# Patient Record
Sex: Female | Born: 1960 | Race: White | Hispanic: No | Marital: Married | Smoking: Former smoker
Health system: Southern US, Community
[De-identification: ages and names within clinical notes are randomized; demographics above are authoritative.]

## PROBLEM LIST (undated history)

## (undated) DIAGNOSIS — K7581 Nonalcoholic steatohepatitis (NASH): Secondary | ICD-10-CM

## (undated) DIAGNOSIS — E119 Type 2 diabetes mellitus without complications: Secondary | ICD-10-CM

## (undated) DIAGNOSIS — D126 Benign neoplasm of colon, unspecified: Secondary | ICD-10-CM

## (undated) DIAGNOSIS — I10 Essential (primary) hypertension: Secondary | ICD-10-CM

## (undated) HISTORY — PX: BREAST SURGERY: SHX581

---
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
## 2004-03-03 ENCOUNTER — Ambulatory Visit: Payer: Self-pay | Admitting: Obstetrics and Gynecology

## 2012-09-12 ENCOUNTER — Emergency Department: Payer: Self-pay | Admitting: Emergency Medicine

## 2012-09-12 LAB — COMPREHENSIVE METABOLIC PANEL
Albumin: 3.6 g/dL (ref 3.4–5.0)
Alkaline Phosphatase: 127 U/L (ref 50–136)
Anion Gap: 8 (ref 7–16)
BUN: 14 mg/dL (ref 7–18)
Bilirubin,Total: 0.8 mg/dL (ref 0.2–1.0)
Calcium, Total: 8.8 mg/dL (ref 8.5–10.1)
Chloride: 97 mmol/L — ABNORMAL LOW (ref 98–107)
Co2: 30 mmol/L (ref 21–32)
Creatinine: 0.97 mg/dL (ref 0.60–1.30)
EGFR (African American): >60
EGFR (Non-African Amer.): >60
Glucose: 343 mg/dL — ABNORMAL HIGH (ref 65–99)
Osmolality: 284 (ref 275–301)
Potassium: 3.1 mmol/L — ABNORMAL LOW (ref 3.5–5.1)
SGOT(AST): 110 U/L — ABNORMAL HIGH (ref 15–37)
SGPT (ALT): 112 U/L — ABNORMAL HIGH (ref 12–78)
Sodium: 135 mmol/L — ABNORMAL LOW (ref 136–145)
Total Protein: 8.1 g/dL (ref 6.4–8.2)

## 2012-09-12 LAB — URINALYSIS, COMPLETE
Bacteria: NONE SEEN
Bilirubin,UR: NEGATIVE
Blood: NEGATIVE
Glucose,UR: >=500 mg/dL (ref 0–75)
Ketone: NEGATIVE
Leukocyte Esterase: NEGATIVE
Nitrite: NEGATIVE
Ph: 6 (ref 4.5–8.0)
Protein: 30
RBC,UR: <1 /HPF (ref 0–5)
Specific Gravity: 1.029 (ref 1.003–1.030)
Squamous Epithelial: 2
WBC UR: <1 /HPF (ref 0–5)

## 2012-09-12 LAB — CBC
HCT: 42.9 % (ref 35.0–47.0)
HGB: 15.1 g/dL (ref 12.0–16.0)
MCH: 31 pg (ref 26.0–34.0)
MCHC: 35.2 g/dL (ref 32.0–36.0)
MCV: 88 fL (ref 80–100)
Platelet: 178 10*3/uL (ref 150–440)
RBC: 4.86 10*6/uL (ref 3.80–5.20)
RDW: 13.4 % (ref 11.5–14.5)
WBC: 9.1 10*3/uL (ref 3.6–11.0)

## 2013-02-08 LAB — HM DIABETES EYE EXAM

## 2013-04-18 ENCOUNTER — Ambulatory Visit: Payer: Self-pay | Admitting: Internal Medicine

## 2013-10-03 DIAGNOSIS — N83201 Unspecified ovarian cyst, right side: Secondary | ICD-10-CM | POA: Insufficient documentation

## 2013-10-03 DIAGNOSIS — N83209 Unspecified ovarian cyst, unspecified side: Secondary | ICD-10-CM | POA: Insufficient documentation

## 2013-10-03 DIAGNOSIS — D219 Benign neoplasm of connective and other soft tissue, unspecified: Secondary | ICD-10-CM | POA: Insufficient documentation

## 2013-12-12 ENCOUNTER — Emergency Department: Payer: Self-pay | Admitting: Emergency Medicine

## 2013-12-12 LAB — CBC WITH DIFFERENTIAL/PLATELET
Basophil #: 0.1 10*3/uL (ref 0.0–0.1)
Basophil %: 0.3 %
Eosinophil #: 0.2 10*3/uL (ref 0.0–0.7)
Eosinophil %: 0.9 %
HCT: 40.9 % (ref 35.0–47.0)
HGB: 13.2 g/dL (ref 12.0–16.0)
Lymphocyte #: 3.3 10*3/uL (ref 1.0–3.6)
Lymphocyte %: 18.1 %
MCH: 26.5 pg (ref 26.0–34.0)
MCHC: 32.3 g/dL (ref 32.0–36.0)
MCV: 82 fL (ref 80–100)
Monocyte #: 1.2 x10 3/mm — ABNORMAL HIGH (ref 0.2–0.9)
Monocyte %: 6.7 %
Neutrophil #: 13.6 10*3/uL — ABNORMAL HIGH (ref 1.4–6.5)
Neutrophil %: 74 %
Platelet: 338 10*3/uL (ref 150–440)
RBC: 4.99 10*6/uL (ref 3.80–5.20)
RDW: 13.8 % (ref 11.5–14.5)
WBC: 18.4 10*3/uL — ABNORMAL HIGH (ref 3.6–11.0)

## 2013-12-12 LAB — COMPREHENSIVE METABOLIC PANEL
Albumin: 2.8 g/dL — ABNORMAL LOW (ref 3.4–5.0)
Alkaline Phosphatase: 53 U/L
Anion Gap: 8 (ref 7–16)
BUN: 16 mg/dL (ref 7–18)
Bilirubin,Total: 0.4 mg/dL (ref 0.2–1.0)
Calcium, Total: 9.6 mg/dL (ref 8.5–10.1)
Chloride: 102 mmol/L (ref 98–107)
Co2: 27 mmol/L (ref 21–32)
Creatinine: 1.04 mg/dL (ref 0.60–1.30)
EGFR (African American): >60
EGFR (Non-African Amer.): >60
Glucose: 116 mg/dL — ABNORMAL HIGH (ref 65–99)
Osmolality: 276 (ref 275–301)
Potassium: 3.9 mmol/L (ref 3.5–5.1)
SGOT(AST): 43 U/L — ABNORMAL HIGH (ref 15–37)
SGPT (ALT): 37 U/L
Sodium: 137 mmol/L (ref 136–145)
Total Protein: 7.4 g/dL (ref 6.4–8.2)

## 2013-12-12 LAB — URINALYSIS, COMPLETE
Bacteria: NONE SEEN
Glucose,UR: NEGATIVE mg/dL (ref 0–75)
Ketone: NEGATIVE
Leukocyte Esterase: NEGATIVE
Nitrite: NEGATIVE
Ph: 5 (ref 4.5–8.0)
Protein: 30
RBC,UR: 1 /HPF (ref 0–5)
Specific Gravity: 1.029 (ref 1.003–1.030)
Squamous Epithelial: 3
WBC UR: 2 /HPF (ref 0–5)

## 2013-12-12 LAB — LIPASE, BLOOD: Lipase: 167 U/L (ref 73–393)

## 2014-02-05 ENCOUNTER — Ambulatory Visit: Payer: Self-pay | Admitting: Gastroenterology

## 2014-03-22 DIAGNOSIS — K7460 Unspecified cirrhosis of liver: Secondary | ICD-10-CM | POA: Insufficient documentation

## 2014-03-22 DIAGNOSIS — R188 Other ascites: Secondary | ICD-10-CM | POA: Insufficient documentation

## 2014-04-02 LAB — HM MAMMOGRAPHY: HM Mammogram: NORMAL

## 2014-10-02 LAB — HM PAP SMEAR
HM Pap smear: NEGATIVE
HM Pap smear: NORMAL

## 2014-11-07 ENCOUNTER — Encounter: Payer: Self-pay | Admitting: Internal Medicine

## 2014-11-07 DIAGNOSIS — E1165 Type 2 diabetes mellitus with hyperglycemia: Secondary | ICD-10-CM | POA: Insufficient documentation

## 2014-11-07 DIAGNOSIS — E785 Hyperlipidemia, unspecified: Secondary | ICD-10-CM

## 2014-11-07 DIAGNOSIS — IMO0002 Reserved for concepts with insufficient information to code with codable children: Secondary | ICD-10-CM | POA: Insufficient documentation

## 2014-11-07 DIAGNOSIS — R188 Other ascites: Secondary | ICD-10-CM | POA: Insufficient documentation

## 2014-11-07 DIAGNOSIS — E1169 Type 2 diabetes mellitus with other specified complication: Secondary | ICD-10-CM | POA: Insufficient documentation

## 2014-11-07 DIAGNOSIS — R180 Malignant ascites: Secondary | ICD-10-CM | POA: Insufficient documentation

## 2014-11-07 DIAGNOSIS — I10 Essential (primary) hypertension: Secondary | ICD-10-CM | POA: Insufficient documentation

## 2014-11-26 ENCOUNTER — Encounter: Payer: Self-pay | Admitting: Internal Medicine

## 2014-11-26 ENCOUNTER — Ambulatory Visit (INDEPENDENT_AMBULATORY_CARE_PROVIDER_SITE_OTHER): Payer: BLUE CROSS/BLUE SHIELD | Admitting: Internal Medicine

## 2014-11-26 VITALS — BP 132/70 | HR 68 | Ht 67.0 in | Wt 215.2 lb

## 2014-11-26 DIAGNOSIS — R180 Malignant ascites: Secondary | ICD-10-CM | POA: Diagnosis not present

## 2014-11-26 DIAGNOSIS — E1165 Type 2 diabetes mellitus with hyperglycemia: Secondary | ICD-10-CM

## 2014-11-26 DIAGNOSIS — IMO0002 Reserved for concepts with insufficient information to code with codable children: Secondary | ICD-10-CM

## 2014-11-26 DIAGNOSIS — I10 Essential (primary) hypertension: Secondary | ICD-10-CM | POA: Diagnosis not present

## 2014-11-26 DIAGNOSIS — R188 Other ascites: Secondary | ICD-10-CM

## 2014-11-26 MED ORDER — METFORMIN HCL 500 MG PO TABS: 500.0000 mg | ORAL_TABLET | Freq: Two times a day (BID) | ORAL | Status: DC

## 2014-11-26 MED ORDER — AMLODIPINE BESYLATE 10 MG PO TABS: 10.0000 mg | ORAL_TABLET | Freq: Every day | ORAL | Status: DC

## 2014-11-26 MED ORDER — LISINOPRIL-HYDROCHLOROTHIAZIDE 20-12.5 MG PO TABS: 2.0000 | ORAL_TABLET | Freq: Every day | ORAL | Status: DC

## 2014-11-26 NOTE — Patient Instructions (Addendum)
Alternative to Januvia:  Daphine Deutscher

## 2014-11-26 NOTE — Progress Notes (Signed)
Date:  11/26/2014   Name:  Meagan Ibarra   DOB:  1961-01-22   MRN:  [id number]   Chief Complaint: Diabetes and Hypertension Diabetes She presents for her follow-up diabetic visit. She has type 2 diabetes mellitus. Her disease course has been stable. Pertinent negatives for diabetes include no chest pain and no fatigue. Symptoms are stable. Current diabetic treatment includes oral agent (monotherapy) (she was supposed to be on Januvia but it is now $90/mo.). She is compliant with treatment all of the time. Her weight is decreasing steadily. She is following a diabetic and generally healthy diet. She participates in exercise daily. Her breakfast blood glucose is taken between 7-8 am. Her breakfast blood glucose range is generally 110-130 mg/dl. An ACE inhibitor/angiotensin II receptor blocker is being taken.  Hypertension This is a chronic problem. The current episode started more than 1 year ago. The problem is unchanged. The problem is controlled. Pertinent negatives include no chest pain, palpitations or shortness of breath. There are no associated agents to hypertension. Risk factors for coronary artery disease include diabetes mellitus. Past treatments include ACE inhibitors, calcium channel blockers and diuretics. The current treatment provides significant improvement.  NASH with Fibrosis Pt is trying to enter a study at [facility] for a synthetic bile acid.  She continues to follow up with her GI MD.  A liver biopsy is being planned.  She also has a consultation for a colonoscopy next week.  Review of Systems:  Review of Systems  Constitutional: Negative for chills, fatigue and unexpected weight change.  HENT: Negative for hearing loss.   Eyes: Negative for visual disturbance.  Respiratory: Negative for cough, chest tightness and shortness of breath.   Cardiovascular: Negative for chest pain, palpitations and leg swelling.  Gastrointestinal: Negative for abdominal pain, constipation and blood in  stool.  Musculoskeletal: Negative for myalgias and joint swelling.  Skin: Negative for rash.  Neurological: Negative for light-headedness and numbness.    Patient Active Problem List   Diagnosis Date Noted  . Ascites of liver 11/07/2014  . Diabetes mellitus type 2, uncontrolled 11/07/2014  . Essential (primary) hypertension 11/07/2014  . Combined fat and carbohydrate induced hyperlipemia 11/07/2014  . Cirrhosis 03/22/2014  . Cyst of ovary 10/03/2013  . Fibroid 10/03/2013    Prior to Admission medications   Medication Sig Start Date End Date Taking? Authorizing Provider  amLODipine (NORVASC) 10 MG tablet Take 1 tablet by mouth daily. 10/25/13  Yes Historical Provider, MD  lisinopril-hydrochlorothiazide (PRINZIDE,ZESTORETIC) 20-12.5 MG per tablet Take 2 tablets by mouth daily. 10/25/13  Yes Historical Provider, MD  metFORMIN (GLUCOPHAGE) 500 MG tablet Take 1 tablet by mouth 2 (two) times daily. 10/25/13  Yes Historical Provider, MD  Norgestimate-Ethinyl Estradiol Triphasic (TRI-SPRINTEC) 0.18/0.215/0.25 MG-35 MCG tablet Take 1 tablet by mouth daily.   Yes Historical Provider, MD    No Known Allergies  Past Surgical History  Procedure Laterality Date  . Breast reduction surgery  1980    History  Substance Use Topics  . Smoking status: Never Smoker   . Smokeless tobacco: Not on file  . Alcohol Use: No     Medication list has been reviewed and updated.  Physical Examination:  Physical Exam  Constitutional: She is oriented to person, place, and time. She appears well-developed and well-nourished.  Neck: Normal range of motion. Neck supple. No thyromegaly present.  Cardiovascular: Normal rate, regular rhythm and normal heart sounds.   Pulmonary/Chest: Effort normal and breath sounds normal. She has no wheezes.  Musculoskeletal:  She exhibits no edema or tenderness.  Neurological: She is alert and oriented to person, place, and time. She has normal reflexes.  Skin: Skin is warm  and dry.  Psychiatric: She has a normal mood and affect.    BP 132/70 mmHg  Pulse 68  Ht 5\' 7"  (1.702 m)  Wt 215 lb 3.2 oz (97.614 kg)  BMI 33.70 kg/m2  Assessment and Plan: 1. Essential (primary) hypertension The current medical regimen is effective;  continue present plan and medications. - lisinopril-hydrochlorothiazide (PRINZIDE,ZESTORETIC) 20-12.5 MG per tablet; Take 2 tablets by mouth daily.  Dispense: 180 tablet; Refill: 3 - amLODipine (NORVASC) 10 MG tablet; Take 1 tablet (10 mg total) by mouth daily.  Dispense: 90 tablet; Refill: 3  2. Diabetes mellitus type 2, uncontrolled Will check labs - consider alternative to [place] (pt will check with insurance on coverage) - Hemoglobin A1c - metFORMIN (GLUCOPHAGE) 500 MG tablet; Take 1 tablet (500 mg total) by mouth 2 (two) times daily.  Dispense: 180 tablet; Refill: 3  3. Ascites of liver Continue follow up at Nestor Ramp, MD [facility]  11/26/2014

## 2014-11-27 LAB — HEMOGLOBIN A1C
Est. average glucose Bld gHb Est-mCnc: 143 mg/dL
Hgb A1c MFr Bld: 6.6 % — ABNORMAL HIGH (ref 4.8–5.6)

## 2014-12-28 ENCOUNTER — Encounter: Payer: Self-pay | Admitting: *Deleted

## 2014-12-31 ENCOUNTER — Encounter
Admission: RE | Disposition: A | Discharge status: Home / Self Care | Payer: Self-pay | Source: Ambulatory Visit | Attending: Gastroenterology

## 2014-12-31 ENCOUNTER — Ambulatory Visit: Payer: BLUE CROSS/BLUE SHIELD | Admitting: Anesthesiology

## 2014-12-31 ENCOUNTER — Encounter: Payer: Self-pay | Admitting: *Deleted

## 2014-12-31 ENCOUNTER — Ambulatory Visit
Admission: RE | Admit: 2014-12-31 | Discharge: 2014-12-31 | Disposition: A | Discharge status: Home / Self Care | Payer: BLUE CROSS/BLUE SHIELD | Source: Ambulatory Visit | Attending: Gastroenterology | Admitting: Gastroenterology

## 2014-12-31 DIAGNOSIS — K7581 Nonalcoholic steatohepatitis (NASH): Secondary | ICD-10-CM | POA: Insufficient documentation

## 2014-12-31 DIAGNOSIS — I10 Essential (primary) hypertension: Secondary | ICD-10-CM | POA: Diagnosis not present

## 2014-12-31 DIAGNOSIS — D120 Benign neoplasm of cecum: Secondary | ICD-10-CM | POA: Insufficient documentation

## 2014-12-31 DIAGNOSIS — Z8371 Family history of colonic polyps: Secondary | ICD-10-CM | POA: Diagnosis not present

## 2014-12-31 DIAGNOSIS — D127 Benign neoplasm of rectosigmoid junction: Secondary | ICD-10-CM | POA: Insufficient documentation

## 2014-12-31 DIAGNOSIS — Z79899 Other long term (current) drug therapy: Secondary | ICD-10-CM | POA: Insufficient documentation

## 2014-12-31 DIAGNOSIS — I252 Old myocardial infarction: Secondary | ICD-10-CM | POA: Diagnosis not present

## 2014-12-31 DIAGNOSIS — Z1211 Encounter for screening for malignant neoplasm of colon: Secondary | ICD-10-CM | POA: Diagnosis not present

## 2014-12-31 DIAGNOSIS — K621 Rectal polyp: Secondary | ICD-10-CM | POA: Diagnosis not present

## 2014-12-31 DIAGNOSIS — E119 Type 2 diabetes mellitus without complications: Secondary | ICD-10-CM | POA: Insufficient documentation

## 2014-12-31 DIAGNOSIS — D123 Benign neoplasm of transverse colon: Secondary | ICD-10-CM | POA: Insufficient documentation

## 2014-12-31 DIAGNOSIS — D125 Benign neoplasm of sigmoid colon: Secondary | ICD-10-CM | POA: Diagnosis not present

## 2014-12-31 HISTORY — DX: Nonalcoholic steatohepatitis (NASH): K75.81

## 2014-12-31 HISTORY — PX: COLONOSCOPY WITH PROPOFOL: SHX5780

## 2014-12-31 HISTORY — DX: Type 2 diabetes mellitus without complications: E11.9

## 2014-12-31 HISTORY — DX: Essential (primary) hypertension: I10

## 2014-12-31 LAB — CBC
HCT: 39.4 % (ref 35.0–47.0)
Hemoglobin: 13.2 g/dL (ref 12.0–16.0)
MCH: 27.7 pg (ref 26.0–34.0)
MCHC: 33.4 g/dL (ref 32.0–36.0)
MCV: 83 fL (ref 80.0–100.0)
Platelets: 208 10*3/uL (ref 150–440)
RBC: 4.75 MIL/uL (ref 3.80–5.20)
RDW: 15.3 % — ABNORMAL HIGH (ref 11.5–14.5)
WBC: 9.6 10*3/uL (ref 3.6–11.0)

## 2014-12-31 LAB — PROTIME-INR
INR: 1
Prothrombin Time: 13.4 seconds (ref 11.4–15.0)

## 2014-12-31 LAB — POCT PREGNANCY, URINE: Preg Test, Ur: NEGATIVE

## 2014-12-31 SURGERY — COLONOSCOPY WITH PROPOFOL
Anesthesia: General

## 2014-12-31 MED ORDER — PROPOFOL 10 MG/ML IV BOLUS
INTRAVENOUS | Status: DC | PRN
  Administered 2014-12-31: 100 mg via INTRAVENOUS

## 2014-12-31 MED ORDER — PHENYLEPHRINE HCL 10 MG/ML IJ SOLN
INTRAMUSCULAR | Status: DC | PRN
  Administered 2014-12-31 (×2): 100 ug via INTRAVENOUS

## 2014-12-31 MED ORDER — LIDOCAINE HCL (CARDIAC) 20 MG/ML IV SOLN
INTRAVENOUS | Status: DC | PRN
  Administered 2014-12-31: 20 mg via INTRAVENOUS

## 2014-12-31 MED ORDER — SODIUM CHLORIDE 0.9 % IV SOLN
INTRAVENOUS | Status: DC
  Administered 2014-12-31: 1000 mL via INTRAVENOUS

## 2014-12-31 MED ORDER — SODIUM CHLORIDE 0.9 % IV SOLN: INTRAVENOUS | Status: DC

## 2014-12-31 MED ORDER — PROPOFOL INFUSION 10 MG/ML OPTIME
INTRAVENOUS | Status: DC | PRN
  Administered 2014-12-31: 150 ug/kg/min via INTRAVENOUS

## 2014-12-31 NOTE — Op Note (Signed)
[facility] Gastroenterology Patient Name: Meagan Ibarra Procedure Date: 12/31/2014 2:17 PM MRN: [id number] Account #: [id number] Date of Birth: [birth year] Admit Type: Outpatient Age: 54 Room: [facility] ENDO ROOM 4 Gender: Female Note Status: Finalized Procedure:         Colonoscopy Indications:       Family history of colonic polyps in a first-degree relative Providers:         Lollie Sails, MD Referring MD:      Halina Maidens, MD (Referring MD) Medicines:         Monitored Anesthesia Care Complications:     No immediate complications. Procedure:         Pre-Anesthesia Assessment:                    - ASA Grade Assessment: III - A patient with severe                     systemic disease.                    After obtaining informed consent, the colonoscope was                     passed under direct vision. Throughout the procedure, the                     patient's blood pressure, pulse, and oxygen saturations                     were monitored continuously. The Olympus PCF-H180AL                     colonoscope ( S#: [id number] ) was introduced through the                     anus and advanced to the the cecum, identified by                     appendiceal orifice and ileocecal valve. The colonoscopy                     was performed with moderate difficulty. Successful                     completion of the procedure was aided by changing the                     patient to a supine position. The patient tolerated the                     procedure well. The quality of the bowel preparation was                     good. Findings:      A 2 mm polyp was found in the transverse colon. The polyp was sessile.       The polyp was removed with a cold biopsy forceps. Resection and       retrieval were complete.      A 7 mm polyp was found in the cecum. The polyp was sessile. The polyp       was removed with a cold snare. Resection and retrieval were complete.      A  localized area of granular mucosa was found at the ileocecal valve.  This was biopsied with a cold forceps for histology.      A 5 mm polyp was found at the hepatic flexure. The polyp was sessile.       The polyp was removed with a cold snare. Resection and retrieval were       complete.      A 2 mm polyp was found at the hepatic flexure. The polyp was sessile.       The polyp was removed with a cold biopsy forceps. Resection and       retrieval were complete.      A 8 mm polyp was found in the proximal transverse colon. The polyp was       sessile. The polyp was removed with a cold snare. Resection and       retrieval were complete.      A 10 mm polyp was found in the proximal sigmoid colon. The polyp was       sessile. The polyp was removed with a hot snare. Resection and retrieval       were complete.      A 11 mm polyp was found in the distal sigmoid colon. The polyp was       sessile. The polyp was removed with a hot snare. Resection and retrieval       were complete.      Six sessile polyps were found in the distal sigmoid colon. The polyps       were 1 to 2 mm in size. These polyps were removed with a cold biopsy       forceps. Resection and retrieval were complete.      A 8 mm polyp was found in the recto-sigmoid colon. The polyp was       sessile. The polyp was removed with a hot snare. Resection and retrieval       were complete.      Three sessile polyps were found in the recto-sigmoid colon. The polyps       were 1 to 2 mm in size. These polyps were removed with a cold biopsy       forceps. Resection and retrieval were complete. (placed in jar with       previous rectosigmoid polyp)      Two sessile polyps were found in the rectum. The polyps were 1 to 2 mm       in size. These polyps were removed with a cold biopsy forceps. Resection       and retrieval were complete.      No additional abnormalities were found on retroflexion.      Non-bleeding internal hemorrhoids  were found during retroflexion. The       hemorrhoids were small. Impression:        - One 2 mm polyp in the transverse colon. Resected and                     retrieved.                    - One 7 mm polyp in the cecum. Resected and retrieved.                    - Granularity at the ileocecal valve. Biopsied.                    - One 5 mm polyp at the hepatic flexure. Resected and  retrieved.                    - One 2 mm polyp at the hepatic flexure. Resected and                     retrieved.                    - One 8 mm polyp in the proximal transverse colon.                     Resected and retrieved.                    - One 10 mm polyp in the proximal sigmoid colon. Resected                     and retrieved.                    - One 11 mm polyp in the distal sigmoid colon. Resected                     and retrieved.                    - Six 1 to 2 mm polyps in the distal sigmoid colon.                     Resected and retrieved.                    - One 8 mm polyp at the recto-sigmoid colon. Resected and                     retrieved.                    - Three 1 to 2 mm polyps at the recto-sigmoid colon.                     Resected and retrieved.                    - Two 1 to 2 mm polyps in the rectum. Resected and                     retrieved. Recommendation:    - Telephone GI clinic for pathology results in 1 week.                    - Full liquid diet and low residue diet for 5 days. Procedure Code(s): --- Professional ---                    [phone], Colonoscopy, flexible; with removal of tumor(s),                     polyp(s), or other lesion(s) by snare technique                    45380, 39, Colonoscopy, flexible; with biopsy, single or                     multiple Diagnosis Code(s): --- Professional ---                    211.3, Benign neoplasm of colon  211.4, Benign neoplasm of rectum and anal canal                    569.89,  Other specified disorders of intestine                    569.0, Anal and rectal polyp                    V18.51, Family history of colonic polyps CPT copyright 2014 American Medical Association. All rights reserved. The codes documented in this report are preliminary and upon coder review may  be revised to meet current compliance requirements. Lollie Sails, MD 12/31/2014 3:31:55 PM This report has been signed electronically. Number of Addenda: 0 Note Initiated On: 12/31/2014 2:17 PM Scope Withdrawal Time: 0 hours 46 minutes 3 seconds  Total Procedure Duration: 0 hours 58 minutes 32 seconds       [facility]

## 2014-12-31 NOTE — Transfer of Care (Signed)
Immediate Anesthesia Transfer of Care Note  Patient: Meagan Ibarra  Procedure(s) Performed: Procedure(s): COLONOSCOPY WITH PROPOFOL (N/A)  Patient Location: Endoscopy Unit  Anesthesia Type:General  Level of Consciousness: sedated  Airway & Oxygen Therapy: Patient Spontanous Breathing and Patient connected to nasal cannula oxygen  Post-op Assessment: Report given to RN and Post -op Vital signs reviewed and stable  Post vital signs: Reviewed and stable  Last Vitals:  Filed Vitals:   12/31/14 1343  BP: 146/86  Pulse: 82  Temp:   Resp: 20    Complications: No apparent anesthesia complications

## 2014-12-31 NOTE — Anesthesia Postprocedure Evaluation (Signed)
  Anesthesia Post-op Note  Patient: Meagan Ibarra  Procedure(s) Performed: Procedure(s): COLONOSCOPY WITH PROPOFOL (N/A)  Anesthesia type:General  Patient location: PACU  Post pain: Pain level controlled  Post assessment: Post-op Vital signs reviewed, Patient's Cardiovascular Status Stable, Respiratory Function Stable, Patent Airway and No signs of Nausea or vomiting  Post vital signs: Reviewed and stable  Last Vitals:  Filed Vitals:   12/31/14 1600  BP: 124/75  Pulse: 54  Temp:   Resp: 18    Level of consciousness: awake, alert  and patient cooperative  Complications: No apparent anesthesia complications

## 2014-12-31 NOTE — H&P (Signed)
Outpatient short stay form Pre-procedure 12/31/2014 2:13 PM Lollie Sails MD  Primary Physician: Dr. Otilio Miu  Reason for visit:  Greening colonoscopy  History of present illness:  Patient is a 54 year old female presenting for her first colonoscopy. He does have a family history of colon polyps and a primary relative. She tolerated her prep well. She takes no aspirin or anticoagulation medications.    Current facility-administered medications:  .  0.9 %  sodium chloride infusion, , Intravenous, Continuous, Lollie Sails, MD, Last Rate: 50 mL/hr at 12/31/14 1354, 1,000 mL at 12/31/14 1354 .  0.9 %  sodium chloride infusion, , Intravenous, Continuous, Lollie Sails, MD  Prescriptions prior to admission  Medication Sig Dispense Refill Last Dose  . amLODipine (NORVASC) 10 MG tablet Take 1 tablet (10 mg total) by mouth daily. 90 tablet 3 12/31/2014 at 630am  . lisinopril-hydrochlorothiazide (PRINZIDE,ZESTORETIC) 20-12.5 MG per tablet Take 2 tablets by mouth daily. 180 tablet 3 12/31/2014 at 630am  . metFORMIN (GLUCOPHAGE) 500 MG tablet Take 1 tablet (500 mg total) by mouth 2 (two) times daily. 180 tablet 3 12/30/2014 at Unknown time  . Norgestimate-Ethinyl Estradiol Triphasic (TRI-SPRINTEC) 0.18/0.215/0.25 MG-35 MCG tablet Take 1 tablet by mouth daily.   12/31/2014 at 630am     No Known Allergies   Past Medical History  Diagnosis Date  . Hypertension   . Diabetes mellitus without complication   . NASH (nonalcoholic steatohepatitis)     Review of systems:      Physical Exam    Heart and lungs: Regular rate and rhythm without rub or gallop lungs are bilaterally clear.    HEENT: Normocephalic atraumatic eyes are anicteric    Other:     Pertinant exam for procedure: Soft nontender nondistended bowel sounds positive normoactive    Planned proceedures: Colonoscopy and indicated procedures I have discussed the risks benefits and complications of procedures to include  not limited to bleeding, infection, perforation and the risk of sedation and the patient wishes to proceed.    Lollie Sails, MD Gastroenterology 12/31/2014  2:13 PM

## 2014-12-31 NOTE — Anesthesia Preprocedure Evaluation (Signed)
Anesthesia Evaluation  Patient identified by MRN, date of birth, ID band Patient awake    Reviewed: Allergy & Precautions, H&P , NPO status , Patient's Chart, lab work & pertinent test results  Airway Mallampati: III  TM Distance: >3 FB Neck ROM: limited    Dental no notable dental hx. (+) Teeth Intact   Pulmonary neg pulmonary ROS,  breath sounds clear to auscultation  Pulmonary exam normal       Cardiovascular Exercise Tolerance: Good hypertension, - Past MI Normal cardiovascular examRhythm:regular Rate:Normal     Neuro/Psych negative neurological ROS  negative psych ROS   GI/Hepatic negative GI ROS, (+) Hepatitis -  Endo/Other  diabetes, Well Controlled, Type 2  Renal/GU negative Renal ROS  negative genitourinary   Musculoskeletal   Abdominal   Peds  Hematology negative hematology ROS (+)   Anesthesia Other Findings Past Medical History:   Hypertension                                                 Diabetes mellitus without complication                       NASH (nonalcoholic steatohepatitis)                          Signs and symptoms suggestive of sleep apnea    Reproductive/Obstetrics negative OB ROS                             Anesthesia Physical Anesthesia Plan  ASA: III  Anesthesia Plan: General   Post-op Pain Management:    Induction:   Airway Management Planned:   Additional Equipment:   Intra-op Plan:   Post-operative Plan:   Informed Consent: I have reviewed the patients History and Physical, chart, labs and discussed the procedure including the risks, benefits and alternatives for the proposed anesthesia with the patient or authorized representative who has indicated his/her understanding and acceptance.   Dental Advisory Given  Plan Discussed with: Anesthesiologist, CRNA and Surgeon  Anesthesia Plan Comments:         Anesthesia Quick  Evaluation

## 2015-01-02 ENCOUNTER — Encounter: Payer: Self-pay | Admitting: Gastroenterology

## 2015-01-03 LAB — SURGICAL PATHOLOGY

## 2015-06-11 ENCOUNTER — Encounter: Payer: Self-pay | Admitting: Internal Medicine

## 2015-06-11 ENCOUNTER — Ambulatory Visit: Payer: BLUE CROSS/BLUE SHIELD | Admitting: Internal Medicine

## 2015-09-25 ENCOUNTER — Ambulatory Visit (INDEPENDENT_AMBULATORY_CARE_PROVIDER_SITE_OTHER): Payer: BLUE CROSS/BLUE SHIELD | Admitting: Internal Medicine

## 2015-09-25 ENCOUNTER — Encounter: Payer: Self-pay | Admitting: Internal Medicine

## 2015-09-25 VITALS — BP 120/80 | HR 87 | Resp 16 | Ht 67.0 in | Wt 214.0 lb

## 2015-09-25 DIAGNOSIS — K7460 Unspecified cirrhosis of liver: Secondary | ICD-10-CM | POA: Diagnosis not present

## 2015-09-25 DIAGNOSIS — E1165 Type 2 diabetes mellitus with hyperglycemia: Secondary | ICD-10-CM

## 2015-09-25 DIAGNOSIS — E119 Type 2 diabetes mellitus without complications: Secondary | ICD-10-CM | POA: Insufficient documentation

## 2015-09-25 DIAGNOSIS — K760 Fatty (change of) liver, not elsewhere classified: Secondary | ICD-10-CM

## 2015-09-25 DIAGNOSIS — IMO0001 Reserved for inherently not codable concepts without codable children: Secondary | ICD-10-CM

## 2015-09-25 DIAGNOSIS — I10 Essential (primary) hypertension: Secondary | ICD-10-CM | POA: Diagnosis not present

## 2015-09-25 MED ORDER — CANAGLIFLOZIN 100 MG PO TABS: 100.0000 mg | ORAL_TABLET | Freq: Every day | ORAL | Status: DC

## 2015-09-25 NOTE — Progress Notes (Signed)
Date:  09/25/2015   Name:  Meagan Ibarra   DOB:  1961/04/21   MRN:  [id number]  Elevated Blood Sugars [facility] Trial has been doing A1C 7.2 last month. BS 199-210 30 minutes after eating.  She feels that she is struggling with diabetes control despite following a good diet and losing weight.  Chief Complaint: Diabetes Diabetes She presents for her follow-up diabetic visit. She has type 2 diabetes mellitus. Her disease course has been worsening. Pertinent negatives for hypoglycemia include no dizziness or headaches. Pertinent negatives for diabetes include no chest pain and no fatigue. Current diabetic treatment includes oral agent (monotherapy) (metformin 500 mg bid). Her weight is stable. She monitors urine at home 1-2 x per day. Her breakfast blood glucose is taken between 7-8 am. Her breakfast blood glucose range is generally 180-200 mg/dl. An ACE inhibitor/angiotensin II receptor blocker is being taken.    Lab Results  Component Value Date   HGBA1C 6.6* 11/26/2014     Review of Systems  Constitutional: Negative for fever, chills and fatigue.  Eyes: Negative for visual disturbance.  Respiratory: Negative for chest tightness, shortness of breath and wheezing.   Cardiovascular: Negative for chest pain, palpitations and leg swelling.  Gastrointestinal: Negative for nausea, abdominal pain and diarrhea.  Genitourinary: Negative for frequency, hematuria and pelvic pain.  Neurological: Negative for dizziness, numbness and headaches.    Patient Active Problem List   Diagnosis Date Noted  . Uncontrolled type 2 diabetes mellitus without complication, without long-term current use of insulin ([place]) 09/25/2015  . Ascites of liver 11/07/2014  . Essential (primary) hypertension 11/07/2014  . Combined fat and carbohydrate induced hyperlipemia 11/07/2014  . Ascites, malignant 11/07/2014  . Cirrhosis ([place]) 03/22/2014  . Cyst of ovary 10/03/2013  . Fibroid 10/03/2013  . Cyst of right ovary  10/03/2013    Prior to Admission medications   Medication Sig Start Date End Date Taking? Authorizing Provider  amLODipine (NORVASC) 10 MG tablet Take 1 tablet (10 mg total) by mouth daily. 11/26/14   Glean Hess, MD  lisinopril-hydrochlorothiazide (PRINZIDE,ZESTORETIC) 20-12.5 MG per tablet Take 2 tablets by mouth daily. 11/26/14   Glean Hess, MD  metFORMIN (GLUCOPHAGE) 500 MG tablet Take 1 tablet (500 mg total) by mouth 2 (two) times daily. 11/26/14   Glean Hess, MD  Norgestimate-Ethinyl Estradiol Triphasic (TRI-SPRINTEC) 0.18/0.215/0.25 MG-35 MCG tablet Take 1 tablet by mouth daily.    Historical Provider, MD    No Known Allergies  Past Surgical History  Procedure Laterality Date  . Breast reduction surgery  1980  . Breast surgery    . Colonoscopy with propofol N/A 12/31/2014    Procedure: COLONOSCOPY WITH PROPOFOL;  Surgeon: Lollie Sails, MD;  Location: [facility] ENDOSCOPY;  Service: Endoscopy;  Laterality: N/A;    Social History  Substance Use Topics  . Smoking status: Never Smoker   . Smokeless tobacco: None  . Alcohol Use: No     Medication list has been reviewed and updated.   Physical Exam  Constitutional: She is oriented to person, place, and time. She appears well-developed. No distress.  HENT:  Head: Normocephalic and atraumatic.  Neck: Normal range of motion. Neck supple. Carotid bruit is not present.  Cardiovascular: Normal rate, regular rhythm and normal heart sounds.   Pulmonary/Chest: Effort normal and breath sounds normal. No respiratory distress.  Musculoskeletal: Normal range of motion.  Lymphadenopathy:    She has no cervical adenopathy.  Neurological: She is alert and oriented to person,  place, and time.  Reflex Scores:      Patellar reflexes are 2+ on the right side and 2+ on the left side. Skin: Skin is warm, dry and intact. No rash noted.  Psychiatric: She has a normal mood and affect. Her behavior is normal. Thought content normal.    Nursing note and vitals reviewed.   BP 120/80 mmHg  Pulse 87  Resp 16  Ht 5\' 7"  (1.702 m)  Wt 214 lb (97.07 kg)  BMI 33.51 kg/m2  SpO2 98%  Assessment and Plan: 1. Uncontrolled type 2 diabetes mellitus without complication, without long-term current use of insulin (HCC) Continue metformin; add invokana Continue to check FSBS - fasting and 2 hours pc Send me A1Cs from study Follow up in 6 months - canagliflozin (INVOKANA) 100 MG TABS tablet; Take 1 tablet (100 mg total) by mouth daily before breakfast.  Dispense: 30 tablet; Refill: 5  2. Essential (primary) hypertension controlled  3. NAFLD (nonalcoholic fatty liver disease) In drug study at [facility] - now open label on Obeticholic acid 25 mg every other day  4. Cirrhosis of liver without ascites, unspecified hepatic cirrhosis type ([place]) Stable - followed at Nestor Ramp, MD [facility]  09/25/2015

## 2015-10-08 DIAGNOSIS — Z1211 Encounter for screening for malignant neoplasm of colon: Secondary | ICD-10-CM | POA: Diagnosis not present

## 2015-10-08 DIAGNOSIS — Z01419 Encounter for gynecological examination (general) (routine) without abnormal findings: Secondary | ICD-10-CM | POA: Diagnosis not present

## 2015-12-25 ENCOUNTER — Other Ambulatory Visit: Payer: Self-pay | Admitting: Internal Medicine

## 2015-12-25 ENCOUNTER — Telehealth: Payer: Self-pay

## 2015-12-25 DIAGNOSIS — E1165 Type 2 diabetes mellitus with hyperglycemia: Secondary | ICD-10-CM

## 2015-12-25 DIAGNOSIS — IMO0001 Reserved for inherently not codable concepts without codable children: Secondary | ICD-10-CM

## 2015-12-25 DIAGNOSIS — I10 Essential (primary) hypertension: Secondary | ICD-10-CM

## 2015-12-25 MED ORDER — METFORMIN HCL 500 MG PO TABS: 500.0000 mg | ORAL_TABLET | Freq: Two times a day (BID) | ORAL | 0 refills | Status: DC

## 2015-12-25 MED ORDER — AMLODIPINE BESYLATE 10 MG PO TABS: 10.0000 mg | ORAL_TABLET | Freq: Every day | ORAL | 0 refills | Status: DC

## 2015-12-25 MED ORDER — LISINOPRIL-HYDROCHLOROTHIAZIDE 20-12.5 MG PO TABS: 2.0000 | ORAL_TABLET | Freq: Every day | ORAL | 0 refills | Status: DC

## 2015-12-25 NOTE — Telephone Encounter (Signed)
To berglund

## 2015-12-25 NOTE — Telephone Encounter (Signed)
Patient would like amlodipine 10 mg, lisinopril hctz 20/12.5 mg, metformin 500 mg called in to [facility]. Patient is running out and will be leaving town on Friday so would like to pickup at the [facility] vs. [employer].

## 2015-12-25 NOTE — Telephone Encounter (Signed)
Done

## 2016-01-22 ENCOUNTER — Telehealth: Payer: Self-pay

## 2016-01-23 ENCOUNTER — Other Ambulatory Visit: Payer: Self-pay | Admitting: Internal Medicine

## 2016-01-23 DIAGNOSIS — E1165 Type 2 diabetes mellitus with hyperglycemia: Secondary | ICD-10-CM

## 2016-01-23 DIAGNOSIS — I10 Essential (primary) hypertension: Secondary | ICD-10-CM

## 2016-01-23 DIAGNOSIS — IMO0002 Reserved for concepts with insufficient information to code with codable children: Secondary | ICD-10-CM

## 2016-01-28 ENCOUNTER — Other Ambulatory Visit: Payer: Self-pay | Admitting: Internal Medicine

## 2016-01-28 DIAGNOSIS — E1165 Type 2 diabetes mellitus with hyperglycemia: Secondary | ICD-10-CM

## 2016-01-28 DIAGNOSIS — I10 Essential (primary) hypertension: Secondary | ICD-10-CM

## 2016-01-28 DIAGNOSIS — IMO0001 Reserved for inherently not codable concepts without codable children: Secondary | ICD-10-CM

## 2016-02-01 DIAGNOSIS — Z23 Encounter for immunization: Secondary | ICD-10-CM | POA: Diagnosis not present

## 2016-02-12 DIAGNOSIS — Z86010 Personal history of colonic polyps: Secondary | ICD-10-CM | POA: Diagnosis not present

## 2016-02-25 NOTE — Telephone Encounter (Signed)
DONE

## 2016-03-17 LAB — HEMOGLOBIN A1C: Hemoglobin A1C: 7.9

## 2016-03-24 DIAGNOSIS — Z1231 Encounter for screening mammogram for malignant neoplasm of breast: Secondary | ICD-10-CM | POA: Diagnosis not present

## 2016-03-27 ENCOUNTER — Ambulatory Visit: Payer: BLUE CROSS/BLUE SHIELD | Admitting: Internal Medicine

## 2016-04-10 IMAGING — CT CT STONE STUDY
2 of 4 series · 17 of 46 positions shown, 19 images · non-contrast
Comparison: CT 03/03/2004

CLINICAL DATA: Nausea and vomiting.  Right sided pain

EXAM:
CT ABDOMEN AND PELVIS WITHOUT CONTRAST
TECHNIQUE: Multidetector CT imaging of the abdomen and pelvis was performed
following the standard protocol without IV contrast.

[Series 2: stone standard full · axial · 0.80mm/px · z∈[-1145,-705]mm · 14 of 96 slices shown, 16 images]
[im 4/96  soft-tissue]
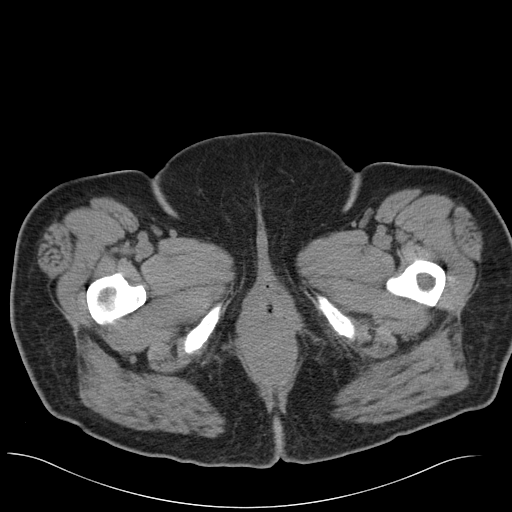
[im 4/96  bone]
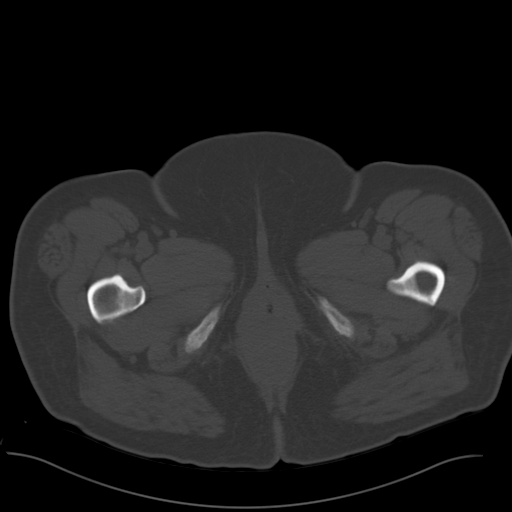
[im 12/96  soft-tissue]
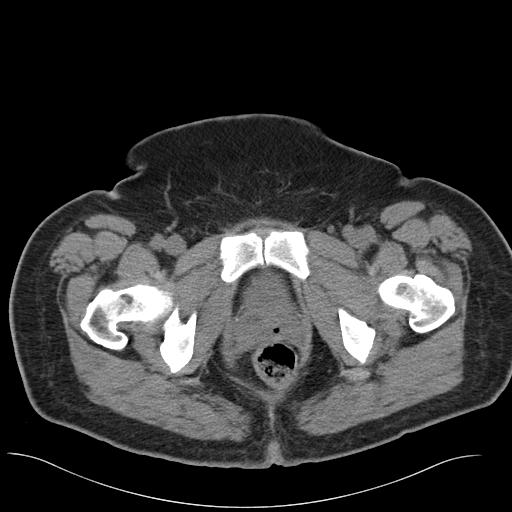
[im 20/96  soft-tissue]
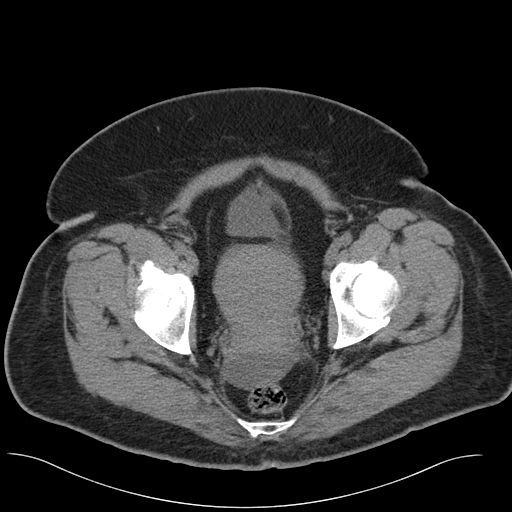
[im 27/96  soft-tissue]
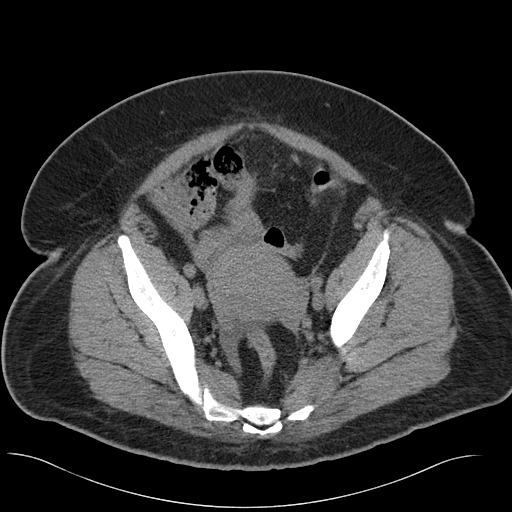
[im 31/96  soft-tissue]
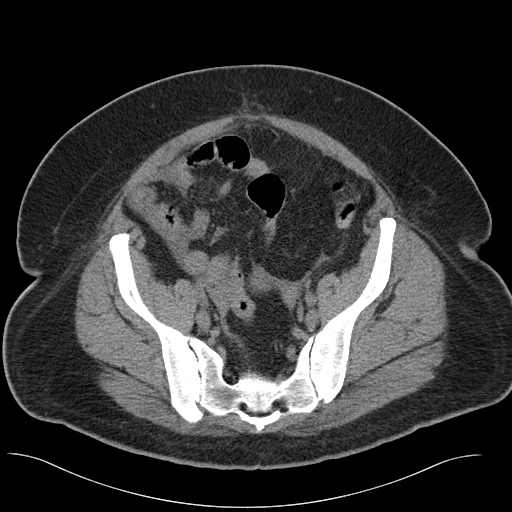
[im 39/96  soft-tissue]
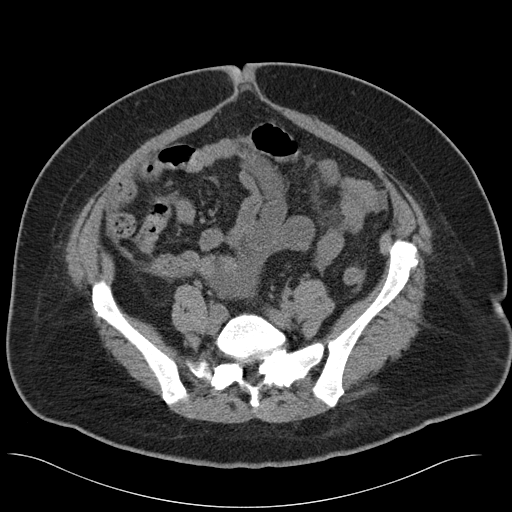
[im 46/96  soft-tissue]
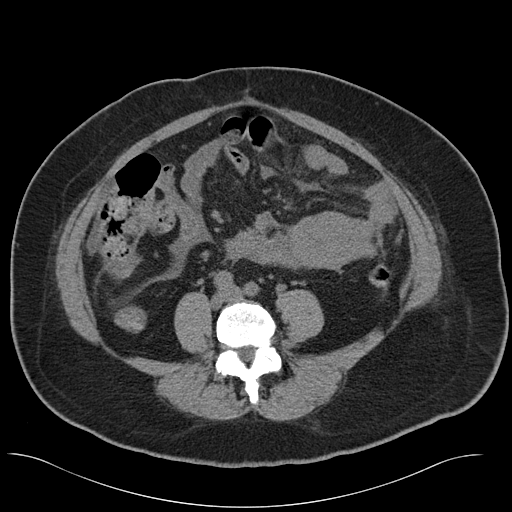
[im 50/96  soft-tissue]
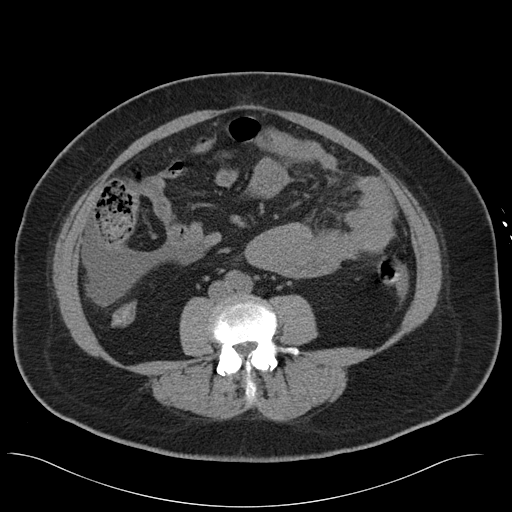
[im 58/96  soft-tissue]
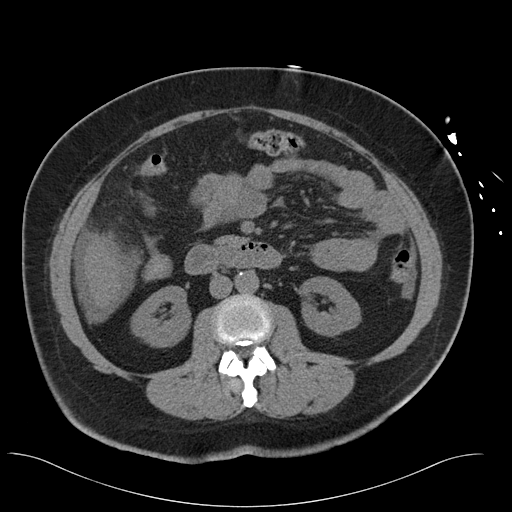
[im 58/96  bone]
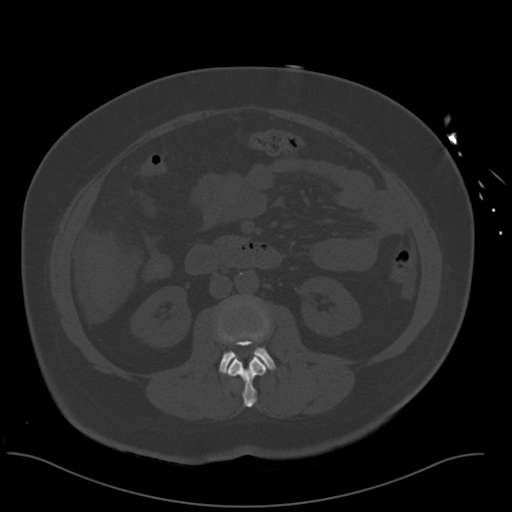
[im 65/96  soft-tissue]
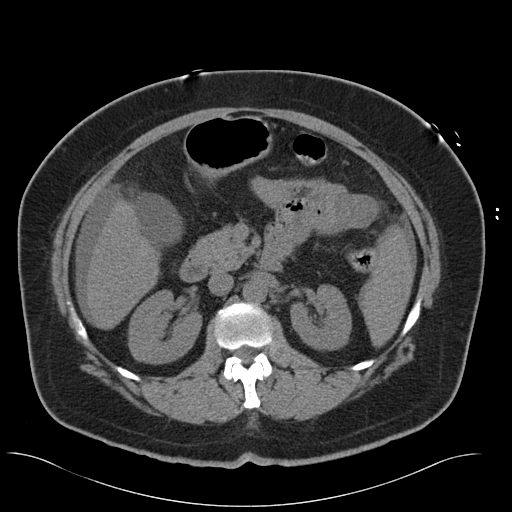
[im 73/96  soft-tissue]
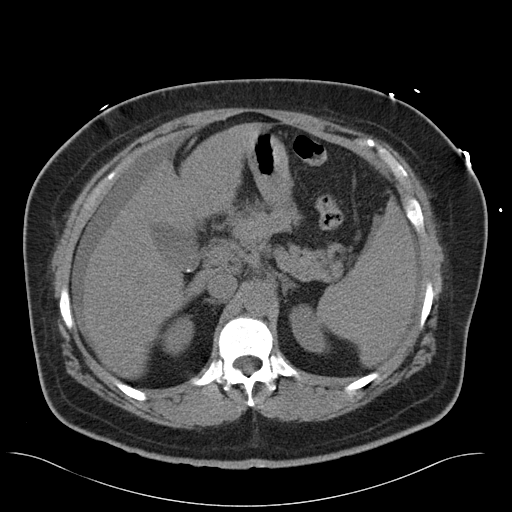
[im 77/96  soft-tissue]
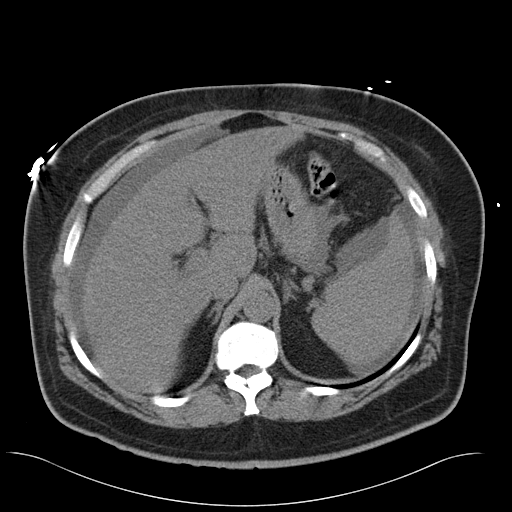
[im 84/96  soft-tissue]
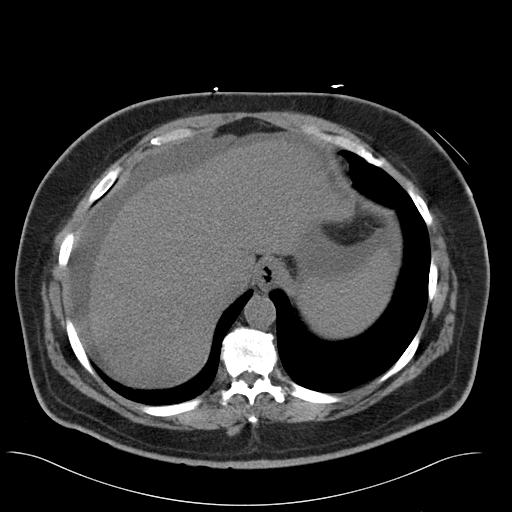
[im 92/96  soft-tissue]
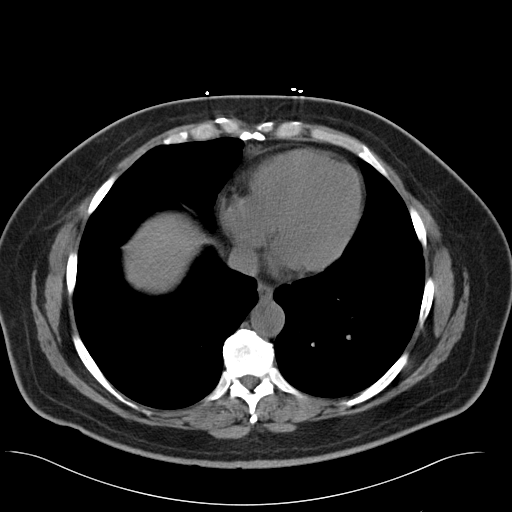

[Series 5: cor stone standard full · coronal · 0.83mm/px · 3 of 153 slices shown]
[im 51/153  soft-tissue]
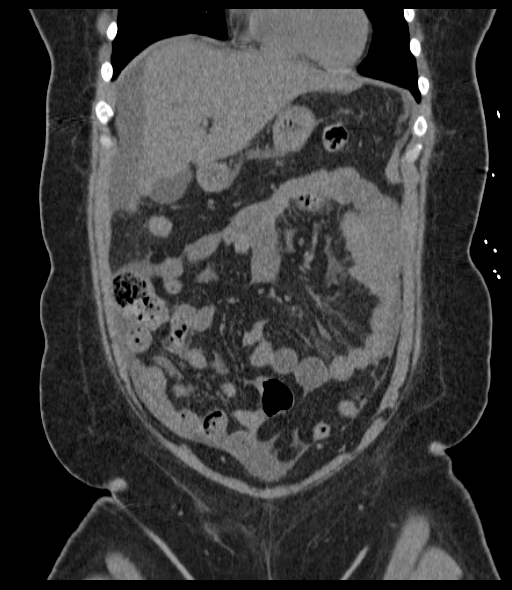
[im 68/153  soft-tissue]
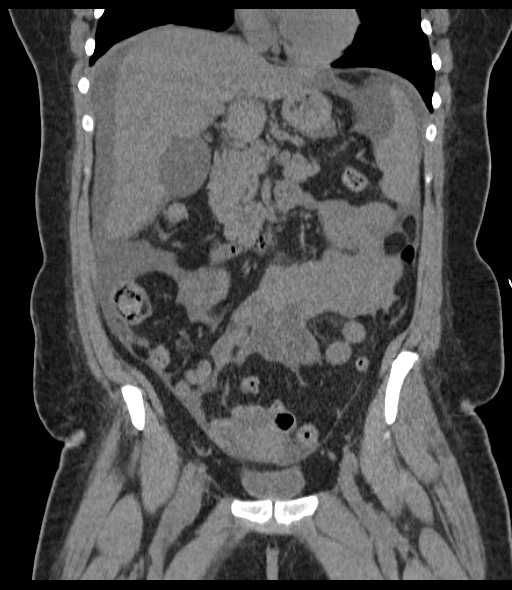
[im 85/153  soft-tissue]
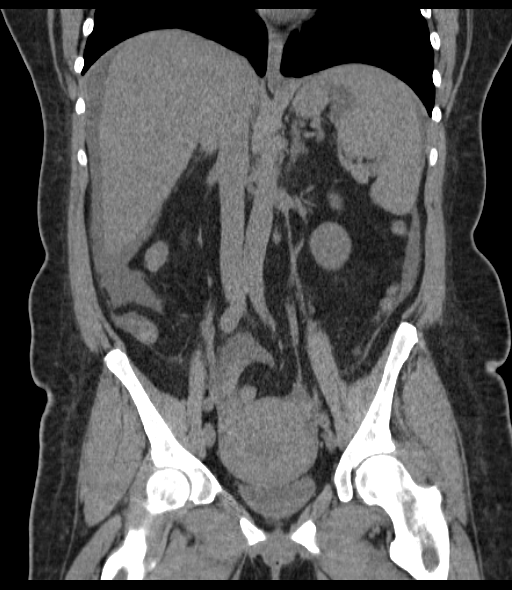

[17 of 46 positions shown; findings below may reference images not displayed]

FINDINGS: Lung bases are clear.

Moderate ascites around the liver spleen and pelvis. Question
underlying liver disease. No liver mass lesion. Small calcified
gallstones. No gallbladder edema or biliary dilatation.

Pancreas is normal.

No renal calculi or renal obstruction. No renal mass on unenhanced
images.

Negative for bowel obstruction or bowel edema. No mass or
adenopathy. Lumbar spondylosis without fracture or acute bony
process.
IMPRESSION: Moderate ascites.  Question underlying liver disease.

Cholelithiasis

Negative for renal calculi or obstruction.

## 2016-04-17 ENCOUNTER — Ambulatory Visit (INDEPENDENT_AMBULATORY_CARE_PROVIDER_SITE_OTHER): Payer: BLUE CROSS/BLUE SHIELD | Admitting: Internal Medicine

## 2016-04-17 ENCOUNTER — Encounter: Payer: Self-pay | Admitting: Internal Medicine

## 2016-04-17 VITALS — BP 122/80 | HR 84 | Resp 16 | Ht 67.0 in | Wt 213.0 lb

## 2016-04-17 DIAGNOSIS — D126 Benign neoplasm of colon, unspecified: Secondary | ICD-10-CM | POA: Insufficient documentation

## 2016-04-17 DIAGNOSIS — IMO0001 Reserved for inherently not codable concepts without codable children: Secondary | ICD-10-CM

## 2016-04-17 DIAGNOSIS — R188 Other ascites: Secondary | ICD-10-CM

## 2016-04-17 DIAGNOSIS — E1165 Type 2 diabetes mellitus with hyperglycemia: Secondary | ICD-10-CM | POA: Diagnosis not present

## 2016-04-17 DIAGNOSIS — K7460 Unspecified cirrhosis of liver: Secondary | ICD-10-CM | POA: Diagnosis not present

## 2016-04-17 DIAGNOSIS — I10 Essential (primary) hypertension: Secondary | ICD-10-CM

## 2016-04-17 DIAGNOSIS — Z8249 Family history of ischemic heart disease and other diseases of the circulatory system: Secondary | ICD-10-CM | POA: Diagnosis not present

## 2016-04-17 MED ORDER — SITAGLIPTIN PHOSPHATE 100 MG PO TABS: 100.0000 mg | ORAL_TABLET | Freq: Every day | ORAL | 5 refills | Status: DC

## 2016-04-17 MED ORDER — LISINOPRIL-HYDROCHLOROTHIAZIDE 20-12.5 MG PO TABS: 1.0000 | ORAL_TABLET | Freq: Every day | ORAL | 3 refills | Status: DC

## 2016-04-17 MED ORDER — AMLODIPINE BESYLATE 10 MG PO TABS: 10.0000 mg | ORAL_TABLET | Freq: Every day | ORAL | 3 refills | Status: DC

## 2016-04-17 MED ORDER — METFORMIN HCL 500 MG PO TABS: 500.0000 mg | ORAL_TABLET | Freq: Two times a day (BID) | ORAL | 3 refills | Status: DC

## 2016-04-17 NOTE — Progress Notes (Signed)
Date:  04/17/2016   Name:  Meagan Ibarra   DOB:  02/06/1961   MRN:  [id number]   Chief Complaint: Diabetes (Had A1C 7.9 on 04/09/2016 at [facility] for her trial. Stopped her Invokona in October  side effects from itching was severe. ) Diabetes  She presents for her follow-up diabetic visit. She has type 2 diabetes mellitus. Pertinent negatives for hypoglycemia include no headaches or tremors. Pertinent negatives for diabetes include no chest pain, no fatigue, no polydipsia and no polyuria. Current diabetic treatments: metformin bid. Compliance with diabetes treatment: stopped invokana.  Stopped invokana due to severe vaginal itching.   She has had Januvia in the past with no problems.  Lab Results  Component Value Date   HGBA1C 7.9 03/17/2016    Review of Systems  Constitutional: Negative for appetite change, fatigue, fever and unexpected weight change.  HENT: Negative for tinnitus and trouble swallowing.   Eyes: Negative for visual disturbance.  Respiratory: Negative for cough, chest tightness and shortness of breath.   Cardiovascular: Negative for chest pain, palpitations and leg swelling.  Gastrointestinal: Negative for abdominal pain.  Endocrine: Negative for polydipsia and polyuria.  Genitourinary: Negative for dysuria and hematuria.  Musculoskeletal: Negative for arthralgias.  Neurological: Negative for tremors, numbness and headaches.  Psychiatric/Behavioral: Negative for dysphoric mood.    Patient Active Problem List   Diagnosis Date Noted  . Adenomatous colon polyp 04/17/2016  . Uncontrolled type 2 diabetes mellitus without complication, without long-term current use of insulin ([place]) 09/25/2015  . Ascites of liver 11/07/2014  . Essential (primary) hypertension 11/07/2014  . Combined fat and carbohydrate induced hyperlipemia 11/07/2014  . Cirrhosis of liver with ascites ([place]) 03/22/2014  . Fibroid 10/03/2013  . Cyst of right ovary 10/03/2013    Prior to Admission  medications   Medication Sig Start Date End Date Taking? Authorizing Provider  amLODipine (NORVASC) 10 MG tablet TAKE 1 (10MG  TOTAL) BY MOUTH DAILY 01/23/16   Glean Hess, MD  atorvastatin (LIPITOR) 10 MG tablet Take 10 mg by mouth daily.    Historical Provider, MD  canagliflozin [facility]) 100 MG TABS tablet Take 1 tablet (100 mg total) by mouth daily before breakfast. 09/25/15   Glean Hess, MD  lisinopril-hydrochlorothiazide (PRINZIDE,ZESTORETIC) 20-12.5 MG tablet TAKE 2 BY MOUTH DAILY 01/23/16   Glean Hess, MD  metFORMIN (GLUCOPHAGE) 500 MG tablet TAKE 1 (500MG  TOTAL) BY MOUTH 2 TIMES DAILY 01/23/16   Glean Hess, MD  Norgestimate-Ethinyl Estradiol Triphasic (TRI-SPRINTEC) 0.18/0.215/0.25 MG-35 MCG tablet Take 1 tablet by mouth daily.    Historical Provider, MD  OBETICHOLIC ACID PO Take 25 mg by mouth every other day.    Historical Provider, MD    No Known Allergies  Past Surgical History:  Procedure Laterality Date  . BREAST REDUCTION SURGERY  1980  . BREAST SURGERY    . COLONOSCOPY WITH PROPOFOL N/A 12/31/2014   19 polyps found    Social History  Substance Use Topics  . Smoking status: Never Smoker  . Smokeless tobacco: Never Used  . Alcohol use No     Medication list has been reviewed and updated.   Physical Exam  Constitutional: She is oriented to person, place, and time. She appears well-developed. No distress.  HENT:  Head: Normocephalic and atraumatic.  Cardiovascular: Normal rate, regular rhythm and normal heart sounds.   Pulmonary/Chest: Effort normal and breath sounds normal. No respiratory distress.  Musculoskeletal: Normal range of motion.  Neurological: She is alert and oriented to  person, place, and time. She has normal reflexes. No sensory deficit.  Skin: Skin is warm and dry. No rash noted.  Psychiatric: She has a normal mood and affect. Her speech is normal and behavior is normal. Thought content normal.  Nursing note and vitals  reviewed.   BP 122/80   Pulse 84   Resp 16   Ht 5\' 7"  (1.702 m)   Wt 213 lb (96.6 kg)   LMP 04/10/2016   SpO2 99%   BMI 33.36 kg/m   Assessment and Plan: 1. Uncontrolled type 2 diabetes mellitus without complication, without long-term current use of insulin ([place]) Continue metformin Add Januvia Labs to be done at Clinical trial - pt will bring results to next visit - sitaGLIPtin (JANUVIA) 100 MG tablet; Take 1 tablet (100 mg total) by mouth daily.  Dispense: 30 tablet; Refill: 5 - metFORMIN (GLUCOPHAGE) 500 MG tablet; Take 1 tablet (500 mg total) by mouth 2 (two) times daily with a meal.  Dispense: 180 tablet; Refill: 3  2. Essential (primary) hypertension controlled - lisinopril-hydrochlorothiazide (PRINZIDE,ZESTORETIC) 20-12.5 MG tablet; Take 1 tablet by mouth daily.  Dispense: 180 tablet; Refill: 3 - amLODipine (NORVASC) 10 MG tablet; Take 1 tablet (10 mg total) by mouth daily.  Dispense: 90 tablet; Refill: 3  3. Cirrhosis of liver with ascites, unspecified hepatic cirrhosis type ([place]) In clinical trial and doing well  4. FH: CAD (coronary artery disease) Consider Cardiology evaluation due to premature CAD in Mother and sister.   Halina Maidens, MD [facility]  04/17/2016

## 2016-06-04 ENCOUNTER — Encounter: Payer: Self-pay | Admitting: *Deleted

## 2016-06-04 IMAGING — US ABDOMEN ULTRASOUND LIMITED
1 series · 13 of 25 positions shown · non-contrast
Comparison: CT abdomen pelvis - 12/12/2013

CLINICAL DATA: Abnormal LFTs. Abnormal abdominal ultrasound.
History of cirrhosis.

EXAM:
1. DUPLEX ULTRASOUND OF LIVER
2. RIGHT UPPER QUADRANT ABDOMINAL ULTRASOUND.
TECHNIQUE: Standard right upper quadrant abdominal ultrasound was performed
followed by color and duplex Doppler evaluation of the hepatic
in-flow and out-flow vessels.

[Series 1: abdomen ultrasound limited · 0.35mm/px · 13 of 44 slices shown]
[im 1/44]
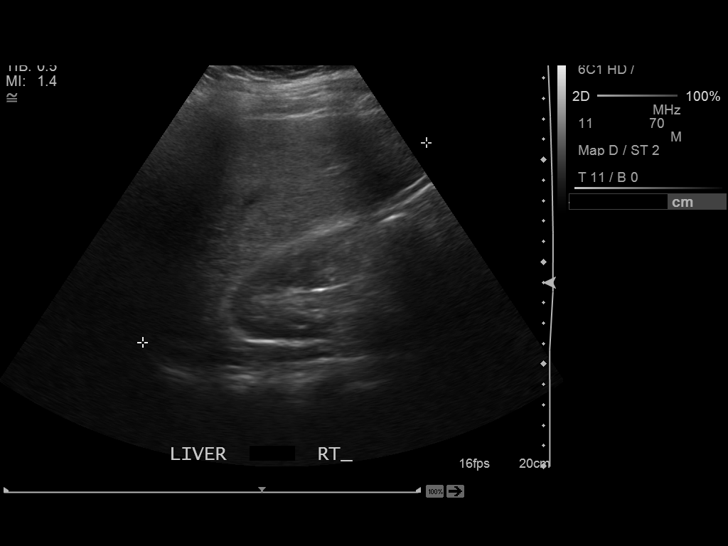
[im 4/44]
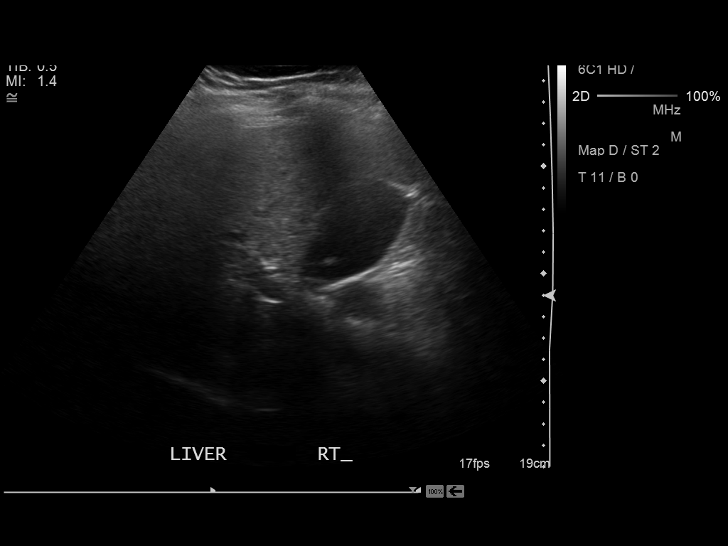
[im 8/44]
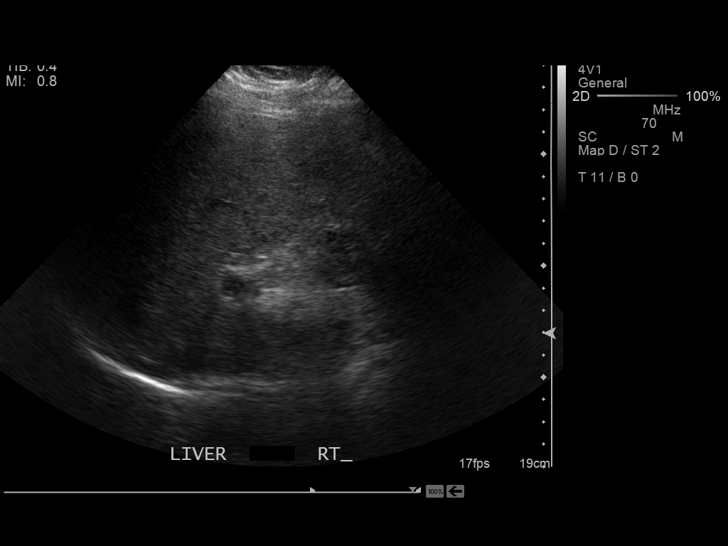
[im 11/44]
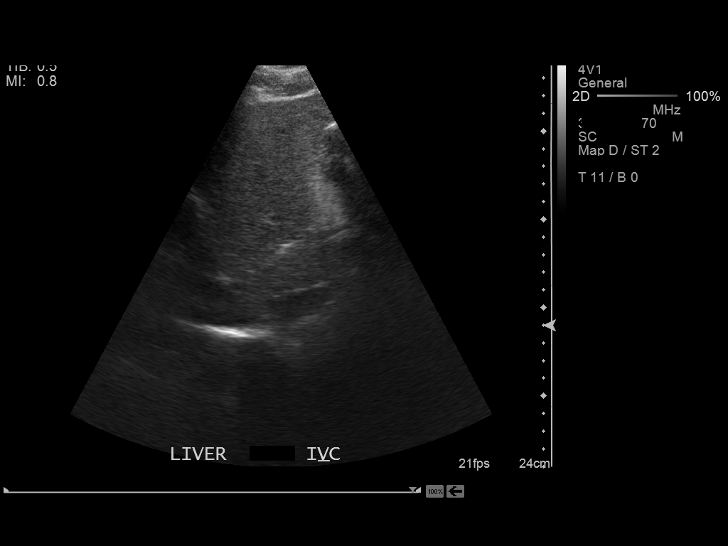
[im 15/44]
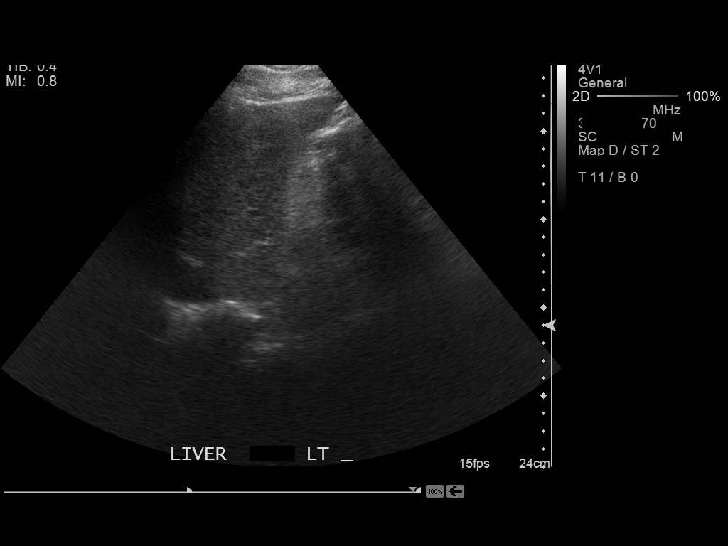
[im 18/44]
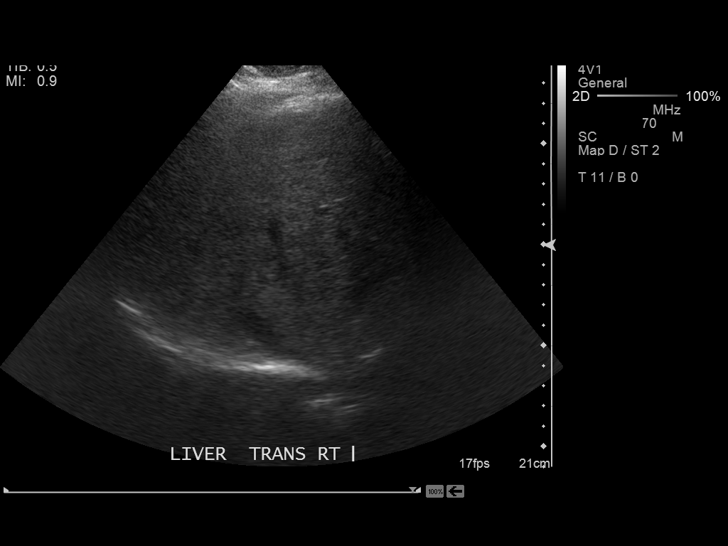
[im 22/44]
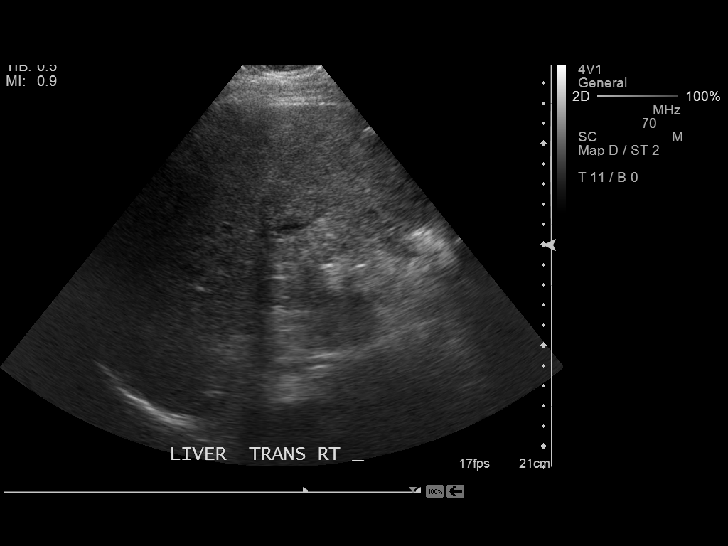
[im 26/44]
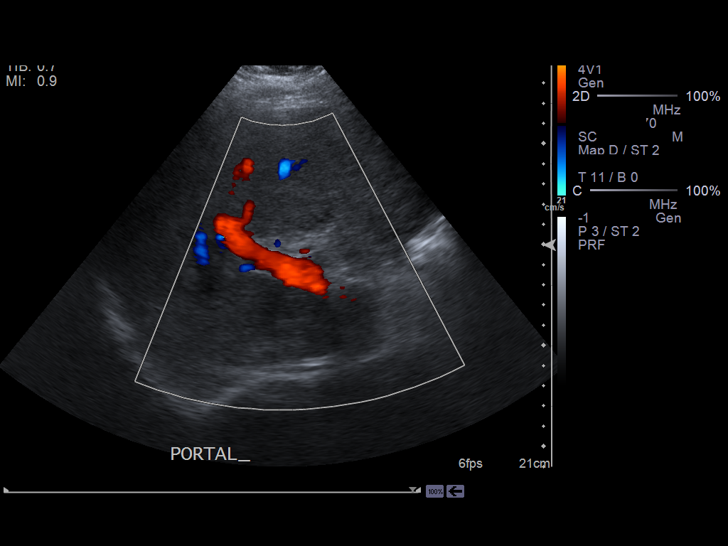
[im 29/44]
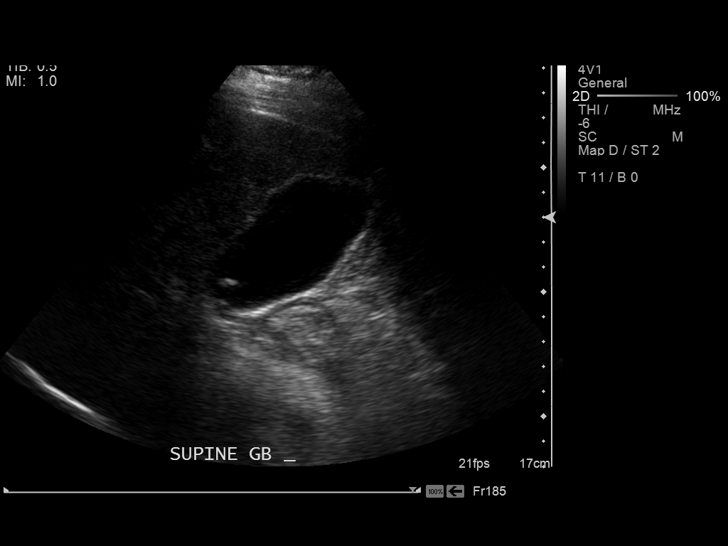
[im 33/44]
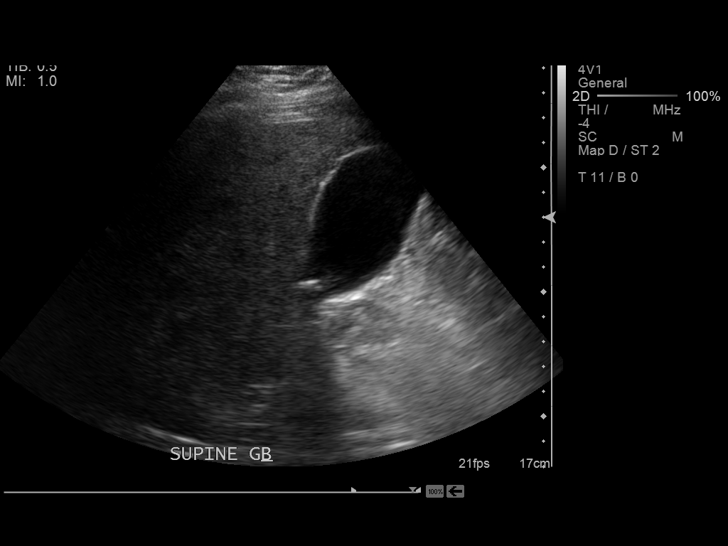
[im 36/44]
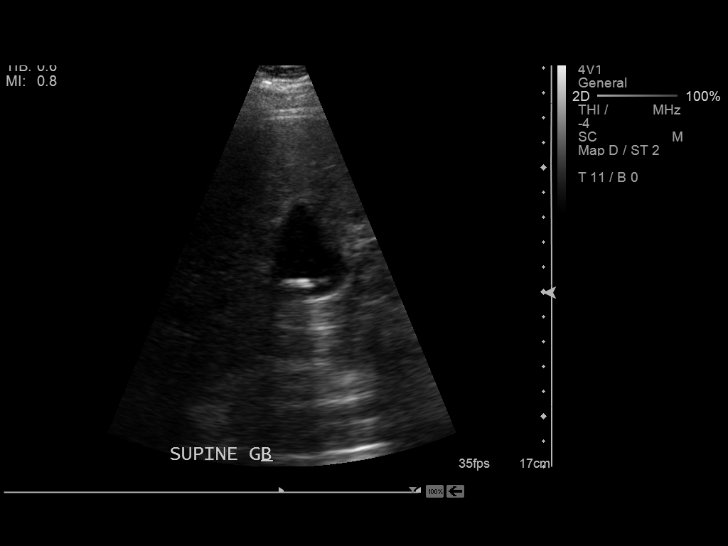
[im 40/44]
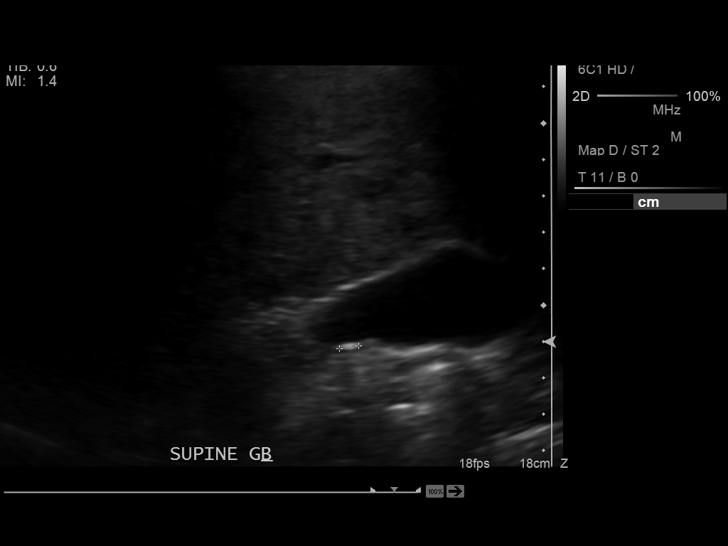
[im 44/44]
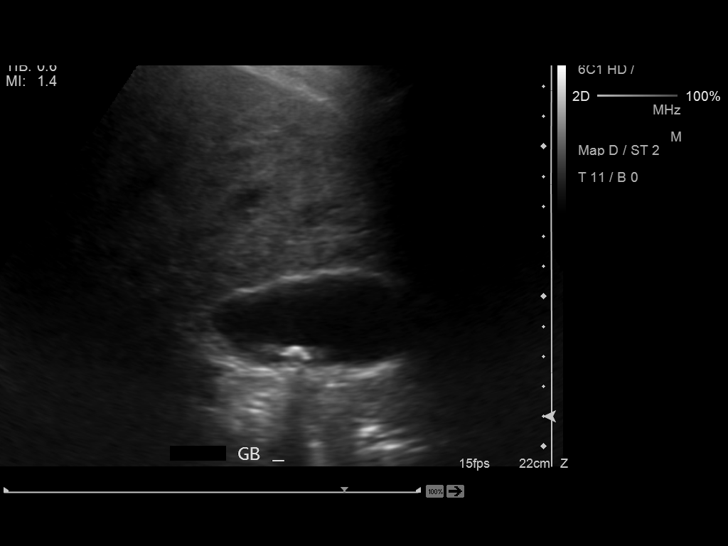

[13 of 25 positions shown; findings below may reference images not displayed]

FINDINGS: Liver: There is diffuse slightly coarsened echogenicity of the
hepatic parenchyma. No discrete hepatic lesions. No intrahepatic
biliary duct dilatation. No ascites.

Gallbladder: There is a minimal amount of echogenic sludge and mole
bowel mobile echogenic shadowing stone measuring approximately
cm in diameter within within otherwise normal-appearing gallbladder.
No gallbladder wall thickening or pericholecystic fluid. Negative
sonographic Murphy's sign.

CBD:  Normal in size measuring 3 mm in diameter

Spleen: Borderline splenomegaly, measuring 10.3 x 13.2 x 6.7 cm
(calculated volume - 472.7 cm3).

-----------------------------------------------------------------------------------------

Portal Vein Velocities

Main:  35.5 cm/sec

Right:  31.6 cm/sec

Left:  16.3 cm/sec

Hepatic Vein Velocities

Right:  14.9 cm/sec

Middle:  24.2 cm/sec

Left:  11.0 cm/sec

Hepatic Artery Velocity:  72 cm/sec

Varices: No varices identified.

Ascites: No intra abdominal ascites.
IMPRESSION: 1. Findings suggestive of hepatic steatosis. No discrete hepatic
lesions. Further evaluation could be performed with abdominal MRI as
clinically indicated.
2. Normal acquired velocities, waveforms and directional flow within
the hepatic vascular system.
3. Borderline splenomegaly, nonspecific though could be seen in
setting of portal venous hypertension. No intra-abdominal ascites.
4. Cholelithiasis without evidence of cholecystitis.

## 2016-06-05 ENCOUNTER — Ambulatory Visit: Payer: BLUE CROSS/BLUE SHIELD | Admitting: Anesthesiology

## 2016-06-05 ENCOUNTER — Encounter
Admission: RE | Disposition: A | Discharge status: Home / Self Care | Payer: Self-pay | Source: Ambulatory Visit | Attending: Gastroenterology

## 2016-06-05 ENCOUNTER — Encounter: Payer: Self-pay | Admitting: *Deleted

## 2016-06-05 ENCOUNTER — Ambulatory Visit
Admission: RE | Admit: 2016-06-05 | Discharge: 2016-06-05 | Disposition: A | Discharge status: Home / Self Care | Payer: BLUE CROSS/BLUE SHIELD | Source: Ambulatory Visit | Attending: Gastroenterology | Admitting: Gastroenterology

## 2016-06-05 DIAGNOSIS — K635 Polyp of colon: Secondary | ICD-10-CM | POA: Diagnosis not present

## 2016-06-05 DIAGNOSIS — I10 Essential (primary) hypertension: Secondary | ICD-10-CM | POA: Insufficient documentation

## 2016-06-05 DIAGNOSIS — Z6833 Body mass index (BMI) 33.0-33.9, adult: Secondary | ICD-10-CM | POA: Insufficient documentation

## 2016-06-05 DIAGNOSIS — D125 Benign neoplasm of sigmoid colon: Secondary | ICD-10-CM | POA: Diagnosis not present

## 2016-06-05 DIAGNOSIS — Z1211 Encounter for screening for malignant neoplasm of colon: Secondary | ICD-10-CM | POA: Diagnosis not present

## 2016-06-05 DIAGNOSIS — K640 First degree hemorrhoids: Secondary | ICD-10-CM | POA: Diagnosis not present

## 2016-06-05 DIAGNOSIS — Z7984 Long term (current) use of oral hypoglycemic drugs: Secondary | ICD-10-CM | POA: Diagnosis not present

## 2016-06-05 DIAGNOSIS — E119 Type 2 diabetes mellitus without complications: Secondary | ICD-10-CM | POA: Insufficient documentation

## 2016-06-05 DIAGNOSIS — Z79899 Other long term (current) drug therapy: Secondary | ICD-10-CM | POA: Diagnosis not present

## 2016-06-05 DIAGNOSIS — Z8371 Family history of colonic polyps: Secondary | ICD-10-CM | POA: Diagnosis not present

## 2016-06-05 DIAGNOSIS — K648 Other hemorrhoids: Secondary | ICD-10-CM | POA: Diagnosis not present

## 2016-06-05 DIAGNOSIS — Z87891 Personal history of nicotine dependence: Secondary | ICD-10-CM | POA: Insufficient documentation

## 2016-06-05 DIAGNOSIS — Q438 Other specified congenital malformations of intestine: Secondary | ICD-10-CM | POA: Diagnosis not present

## 2016-06-05 DIAGNOSIS — D126 Benign neoplasm of colon, unspecified: Secondary | ICD-10-CM | POA: Insufficient documentation

## 2016-06-05 DIAGNOSIS — D124 Benign neoplasm of descending colon: Secondary | ICD-10-CM | POA: Diagnosis not present

## 2016-06-05 DIAGNOSIS — E669 Obesity, unspecified: Secondary | ICD-10-CM | POA: Diagnosis not present

## 2016-06-05 DIAGNOSIS — Z86010 Personal history of colonic polyps: Secondary | ICD-10-CM | POA: Insufficient documentation

## 2016-06-05 DIAGNOSIS — D120 Benign neoplasm of cecum: Secondary | ICD-10-CM | POA: Diagnosis not present

## 2016-06-05 HISTORY — PX: COLONOSCOPY WITH PROPOFOL: SHX5780

## 2016-06-05 HISTORY — DX: Benign neoplasm of colon, unspecified: D12.6

## 2016-06-05 LAB — CBC WITH DIFFERENTIAL/PLATELET
Basophils Absolute: 0.1 10*3/uL (ref 0–0.1)
Basophils Relative: 0 %
Eosinophils Absolute: 0.1 10*3/uL (ref 0–0.7)
Eosinophils Relative: 1 %
HCT: 41.5 % (ref 35.0–47.0)
Hemoglobin: 14.6 g/dL (ref 12.0–16.0)
Lymphocytes Relative: 13 %
Lymphs Abs: 1.6 10*3/uL (ref 1.0–3.6)
MCH: 29.6 pg (ref 26.0–34.0)
MCHC: 35 g/dL (ref 32.0–36.0)
MCV: 84.5 fL (ref 80.0–100.0)
Monocytes Absolute: 0.8 10*3/uL (ref 0.2–0.9)
Monocytes Relative: 7 %
Neutro Abs: 9.4 10*3/uL — ABNORMAL HIGH (ref 1.4–6.5)
Neutrophils Relative %: 79 %
Platelets: 206 10*3/uL (ref 150–440)
RBC: 4.92 MIL/uL (ref 3.80–5.20)
RDW: 13.8 % (ref 11.5–14.5)
WBC: 11.9 10*3/uL — ABNORMAL HIGH (ref 3.6–11.0)

## 2016-06-05 LAB — POCT PREGNANCY, URINE: Preg Test, Ur: NEGATIVE

## 2016-06-05 LAB — PROTIME-INR
INR: 1.01
Prothrombin Time: 13.3 seconds (ref 11.4–15.2)

## 2016-06-05 LAB — GLUCOSE, CAPILLARY: Glucose-Capillary: 190 mg/dL — ABNORMAL HIGH (ref 65–99)

## 2016-06-05 SURGERY — COLONOSCOPY WITH PROPOFOL
Anesthesia: General

## 2016-06-05 MED ORDER — PHENYLEPHRINE HCL 10 MG/ML IJ SOLN
INTRAMUSCULAR | Status: DC | PRN
  Administered 2016-06-05: 80 ug via INTRAVENOUS

## 2016-06-05 MED ORDER — LIDOCAINE HCL (PF) 2 % IJ SOLN
INTRAMUSCULAR | Status: AC
  Filled 2016-06-05: qty 2

## 2016-06-05 MED ORDER — SODIUM CHLORIDE 0.9 % IV SOLN: INTRAVENOUS | Status: DC

## 2016-06-05 MED ORDER — LIDOCAINE HCL (CARDIAC) 20 MG/ML IV SOLN
INTRAVENOUS | Status: DC | PRN
  Administered 2016-06-05: 2 mL via INTRAVENOUS

## 2016-06-05 MED ORDER — PROPOFOL 500 MG/50ML IV EMUL
INTRAVENOUS | Status: AC
  Filled 2016-06-05: qty 50

## 2016-06-05 MED ORDER — PHENYLEPHRINE HCL 10 MG/ML IJ SOLN
INTRAMUSCULAR | Status: AC
  Filled 2016-06-05: qty 1

## 2016-06-05 MED ORDER — PROPOFOL 500 MG/50ML IV EMUL
INTRAVENOUS | Status: DC | PRN
  Administered 2016-06-05: 120 ug/kg/min via INTRAVENOUS

## 2016-06-05 MED ORDER — SODIUM CHLORIDE 0.9 % IV SOLN
INTRAVENOUS | Status: DC
  Administered 2016-06-05: 1000 mL via INTRAVENOUS

## 2016-06-05 MED ORDER — PROPOFOL 10 MG/ML IV BOLUS
INTRAVENOUS | Status: DC | PRN
  Administered 2016-06-05: 40 mg via INTRAVENOUS
  Administered 2016-06-05: 20 mg via INTRAVENOUS

## 2016-06-05 NOTE — Anesthesia Post-op Follow-up Note (Signed)
Anesthesia QCDR form completed.        

## 2016-06-05 NOTE — Transfer of Care (Signed)
Immediate Anesthesia Transfer of Care Note  Patient: Meagan Ibarra  Procedure(s) Performed: Procedure(s): COLONOSCOPY WITH PROPOFOL (N/A)  Patient Location: PACU  Anesthesia Type:General  Level of Consciousness: awake  Airway & Oxygen Therapy: Patient Spontanous Breathing and Patient connected to nasal cannula oxygen  Post-op Assessment: Report given to RN and Post -op Vital signs reviewed and stable  Post vital signs: Reviewed and stable  Last Vitals:  Vitals:   06/05/16 0737  BP: (!) 142/98  Pulse: 93  Resp: 16  Temp: 36.7 C    Last Pain:  Vitals:   06/05/16 0737  TempSrc: Tympanic         Complications: No apparent anesthesia complications

## 2016-06-05 NOTE — Anesthesia Postprocedure Evaluation (Signed)
Anesthesia Post Note  Patient: Meagan Ibarra  Procedure(s) Performed: Procedure(s) (LRB): COLONOSCOPY WITH PROPOFOL (N/A)  Patient location during evaluation: PACU Anesthesia Type: General Level of consciousness: awake Pain management: pain level controlled Vital Signs Assessment: post-procedure vital signs reviewed and stable Respiratory status: spontaneous breathing Cardiovascular status: stable Anesthetic complications: no     Last Vitals:  Vitals:   06/05/16 1014 06/05/16 1024  BP: 126/79 117/67  Pulse: 66 70  Resp: 19 17  Temp:      Last Pain:  Vitals:   06/05/16 0944  TempSrc: Tympanic                 VAN STAVEREN,Babak Lucus

## 2016-06-05 NOTE — Anesthesia Preprocedure Evaluation (Signed)
Anesthesia Evaluation  Patient identified by MRN, date of birth, ID band Patient awake    Reviewed: Allergy & Precautions, NPO status , Patient's Chart, lab work & pertinent test results  Airway Mallampati: II       Dental  (+) Teeth Intact   Pulmonary former smoker,     + decreased breath sounds      Cardiovascular Exercise Tolerance: Good hypertension, Pt. on medications  Rhythm:Regular Rate:Normal     Neuro/Psych    GI/Hepatic negative GI ROS, Neg liver ROS,   Endo/Other  diabetes, Type 2, Oral Hypoglycemic AgentsMorbid obesity  Renal/GU      Musculoskeletal negative musculoskeletal ROS (+)   Abdominal (+) + obese,   Peds  Hematology   Anesthesia Other Findings   Reproductive/Obstetrics                             Anesthesia Physical Anesthesia Plan  ASA: II  Anesthesia Plan: General   Post-op Pain Management:    Induction: Intravenous  Airway Management Planned: Natural Airway and Nasal Cannula  Additional Equipment:   Intra-op Plan:   Post-operative Plan:   Informed Consent: I have reviewed the patients History and Physical, chart, labs and discussed the procedure including the risks, benefits and alternatives for the proposed anesthesia with the patient or authorized representative who has indicated his/her understanding and acceptance.     Plan Discussed with: Surgeon  Anesthesia Plan Comments:         Anesthesia Quick Evaluation

## 2016-06-05 NOTE — Op Note (Signed)
[facility] Gastroenterology Patient Name: Meagan Ibarra Procedure Date: 06/05/2016 8:46 AM MRN: [id number] Account #: [id number] Date of Birth: [birth year] Admit Type: Outpatient Age: 56 Room: [facility] ENDO ROOM 1 Gender: Female Note Status: Finalized Procedure:            Colonoscopy Indications:          Personal history of colonic polyps Providers:            Lollie Sails, MD Referring MD:         Halina Maidens, MD (Referring MD) Medicines:            Monitored Anesthesia Care Complications:        No immediate complications. Procedure:            Pre-Anesthesia Assessment:                       - ASA Grade Assessment: III - A patient with severe                        systemic disease.                       After obtaining informed consent, the colonoscope was                        passed under direct vision. Throughout the procedure,                        the patient's blood pressure, pulse, and oxygen                        saturations were monitored continuously. The                        Colonoscope was introduced through the anus and                        advanced to the the cecum, identified by appendiceal                        orifice and ileocecal valve. The colonoscopy was                        performed with moderate difficulty due to significant                        looping and a tortuous colon. Successful completion of                        the procedure was aided by changing the patient to a                        supine position, changing the patient to a prone                        position and using manual pressure. The patient                        tolerated the procedure well. The quality of the bowel  preparation was good. Findings:      Four sessile polyps were found in the distal sigmoid colon. The polyps       were 1 to 3 mm in size. These polyps were removed with a cold biopsy       forceps, one with  a cold snare. Resection and retrieval were complete.      A 3 mm polyp was found in the descending colon. The polyp was sessile.       The polyp was removed with a cold snare. Resection and retrieval were       complete.      A localized area of granular mucosa was found at the ileocecal valve.       Biopsies were taken with a cold forceps for histology.      The digital rectal exam was normal.      Internal hemorrhoids were found during retroflexion. The hemorrhoids       were small and Grade I (internal hemorrhoids that do not prolapse). Impression:           - Four 1 to 3 mm polyps in the distal sigmoid colon,                        removed with a cold biopsy forceps. Resected and                        retrieved.                       - One 3 mm polyp in the descending colon, removed with                        a cold snare. Resected and retrieved.                       - Granularity at the ileocecal valve. Biopsied.                       - Internal hemorrhoids. Recommendation:       - Await pathology results.                       - Telephone GI clinic for pathology results in 1 week. Procedure Code(s):    --- Professional ---                       [phone], Colonoscopy, flexible; with removal of tumor(s),                        polyp(s), or other lesion(s) by snare technique                       [id number], 86, Colonoscopy, flexible; with biopsy, single                        or multiple Diagnosis Code(s):    --- Professional ---                       D12.5, Benign neoplasm of sigmoid colon                       D12.4, Benign neoplasm of descending colon  K63.89, Other specified diseases of intestine                       K64.0, First degree hemorrhoids                       Z86.010, Personal history of colonic polyps CPT copyright 2016 American Medical Association. All rights reserved. The codes documented in this report are preliminary and upon coder review may   be revised to meet current compliance requirements. Lollie Sails, MD 06/05/2016 9:41:52 AM This report has been signed electronically. Number of Addenda: 0 Note Initiated On: 06/05/2016 8:46 AM Scope Withdrawal Time: 0 hours 12 minutes 21 seconds  Total Procedure Duration: 0 hours 42 minutes 9 seconds       [facility]

## 2016-06-05 NOTE — H&P (Addendum)
Outpatient short stay form Pre-procedure 06/05/2016 8:18 AM Meagan Sails MD  Primary Physician: Dr. Halina Maidens  Reason for visit:  Colonoscopy  History of present illness:  Patient is a 56 year old female presenting today as above. Personal history of a colonoscopy being done 01/21/2015 for screening purposes that moved 19 polyps multiple of them being adenomatous. One of them was over 10 years. Repeat check. She tolerated her prep. She takes no aspirin or blood thinning agents. She does have a history of Karlene Lineman and we are drawing a time and CBC prior to proceeding.    Current Facility-Administered Medications:  .  0.9 %  sodium chloride infusion, , Intravenous, Continuous, Meagan Sails, MD .  0.9 %  sodium chloride infusion, , Intravenous, Continuous, Meagan Sails, MD  Prescriptions Prior to Admission  Medication Sig Dispense Refill Last Dose  . amLODipine (NORVASC) 10 MG tablet Take 1 tablet (10 mg total) by mouth daily. 90 tablet 3   . atorvastatin (LIPITOR) 10 MG tablet Take 10 mg by mouth daily.   Taking  . lisinopril-hydrochlorothiazide (PRINZIDE,ZESTORETIC) 20-12.5 MG tablet Take 1 tablet by mouth daily. 180 tablet 3   . metFORMIN (GLUCOPHAGE) 500 MG tablet Take 1 tablet (500 mg total) by mouth 2 (two) times daily with a meal. 180 tablet 3   . Norgestimate-Ethinyl Estradiol Triphasic (TRI-SPRINTEC) 0.18/0.215/0.25 MG-35 MCG tablet Take 1 tablet by mouth daily.   Taking  . OBETICHOLIC ACID PO Take 25 mg by mouth every other day.   Taking  . sitaGLIPtin (JANUVIA) 100 MG tablet Take 1 tablet (100 mg total) by mouth daily. 30 tablet 5      Allergies  Allergen Reactions  . Invokana [Canagliflozin] Itching     Past Medical History:  Diagnosis Date  . Diabetes mellitus without complication ([place])   . Hypertension   . NASH (nonalcoholic steatohepatitis)   . NASH (nonalcoholic steatohepatitis)   . Tubular adenoma of colon     Review of systems:      Physical  Exam    Heart and lungs: Regular rate and rhythm without rub or gallop, lungs are bilaterally clear.    HEENT: Normocephalic atraumatic eyes are anicteric    Other:     Pertinant exam for procedure: Off nontender nondistended bowel sounds positive normoactive.    Planned proceedures: Colonoscopy and indicated procedures. I have discussed the risks benefits and complications of procedures to include not limited to bleeding, infection, perforation and the risk of sedation and the patient wishes to proceed.    Meagan Sails, MD Gastroenterology 06/05/2016  8:18 AM

## 2016-06-08 ENCOUNTER — Encounter: Payer: Self-pay | Admitting: Gastroenterology

## 2016-06-08 LAB — SURGICAL PATHOLOGY

## 2016-07-15 ENCOUNTER — Other Ambulatory Visit: Payer: Self-pay | Admitting: Internal Medicine

## 2016-07-15 ENCOUNTER — Telehealth: Payer: Self-pay

## 2016-07-15 DIAGNOSIS — I10 Essential (primary) hypertension: Secondary | ICD-10-CM

## 2016-07-15 MED ORDER — LISINOPRIL-HYDROCHLOROTHIAZIDE 20-12.5 MG PO TABS: 1.0000 | ORAL_TABLET | Freq: Two times a day (BID) | ORAL | 0 refills | Status: DC

## 2016-07-15 MED ORDER — LISINOPRIL-HYDROCHLOROTHIAZIDE 20-12.5 MG PO TABS: 1.0000 | ORAL_TABLET | Freq: Two times a day (BID) | ORAL | 3 refills | Status: DC

## 2016-07-15 NOTE — Telephone Encounter (Signed)
The Rx was sent for 180 tablets so she should have had enough for twice a day even if the directions said once a day.  The pharmacy should have contacted [place] for clarification.  I re-sent the mail order with correct directions and a 30 day supply as well.

## 2016-07-15 NOTE — Telephone Encounter (Signed)
Pt called stating medication Lisinopril HCTZ was sent in incorrectly. She is supposed to be taking BID, but medication only says once daily. Due to this she is now out of medication. Wants 30 day supply sent to [facility] in [place] since she is currently out.

## 2016-07-16 NOTE — Telephone Encounter (Signed)
Tried to contact pt to inform-- awaiting call back.

## 2016-07-20 ENCOUNTER — Ambulatory Visit: Payer: BLUE CROSS/BLUE SHIELD | Admitting: Internal Medicine

## 2016-07-20 ENCOUNTER — Other Ambulatory Visit: Payer: Self-pay | Admitting: Internal Medicine

## 2016-07-20 ENCOUNTER — Telehealth: Payer: Self-pay

## 2016-07-20 MED ORDER — AMOXICILLIN-POT CLAVULANATE 875-125 MG PO TABS: 1.0000 | ORAL_TABLET | Freq: Two times a day (BID) | ORAL | 0 refills | Status: AC

## 2016-07-20 NOTE — Telephone Encounter (Signed)
Pt needs antibiotic for sinus infection sent to [facility] in [place]. (due to having to reschedule for appt because of weather today).

## 2016-07-28 DIAGNOSIS — E119 Type 2 diabetes mellitus without complications: Secondary | ICD-10-CM | POA: Diagnosis not present

## 2016-07-28 DIAGNOSIS — H524 Presbyopia: Secondary | ICD-10-CM | POA: Diagnosis not present

## 2016-07-28 DIAGNOSIS — H2513 Age-related nuclear cataract, bilateral: Secondary | ICD-10-CM | POA: Diagnosis not present

## 2016-07-28 DIAGNOSIS — E089 Diabetes mellitus due to underlying condition without complications: Secondary | ICD-10-CM | POA: Diagnosis not present

## 2016-10-17 ENCOUNTER — Other Ambulatory Visit: Payer: Self-pay | Admitting: Internal Medicine

## 2016-10-17 DIAGNOSIS — E1165 Type 2 diabetes mellitus with hyperglycemia: Principal | ICD-10-CM

## 2016-10-17 DIAGNOSIS — IMO0001 Reserved for inherently not codable concepts without codable children: Secondary | ICD-10-CM

## 2016-10-30 ENCOUNTER — Encounter: Payer: Self-pay | Admitting: Internal Medicine

## 2016-11-05 ENCOUNTER — Ambulatory Visit (INDEPENDENT_AMBULATORY_CARE_PROVIDER_SITE_OTHER): Payer: BLUE CROSS/BLUE SHIELD | Admitting: Internal Medicine

## 2016-11-05 ENCOUNTER — Encounter: Payer: Self-pay | Admitting: Internal Medicine

## 2016-11-05 VITALS — BP 118/76 | HR 83 | Ht 67.0 in | Wt 223.0 lb

## 2016-11-05 DIAGNOSIS — I10 Essential (primary) hypertension: Secondary | ICD-10-CM | POA: Diagnosis not present

## 2016-11-05 DIAGNOSIS — IMO0001 Reserved for inherently not codable concepts without codable children: Secondary | ICD-10-CM

## 2016-11-05 DIAGNOSIS — E782 Mixed hyperlipidemia: Secondary | ICD-10-CM | POA: Diagnosis not present

## 2016-11-05 DIAGNOSIS — K7460 Unspecified cirrhosis of liver: Secondary | ICD-10-CM

## 2016-11-05 DIAGNOSIS — E1165 Type 2 diabetes mellitus with hyperglycemia: Secondary | ICD-10-CM | POA: Diagnosis not present

## 2016-11-05 DIAGNOSIS — R188 Other ascites: Secondary | ICD-10-CM

## 2016-11-05 MED ORDER — LISINOPRIL-HYDROCHLOROTHIAZIDE 20-12.5 MG PO TABS: 1.0000 | ORAL_TABLET | Freq: Two times a day (BID) | ORAL | 1 refills | Status: DC

## 2016-11-05 MED ORDER — ATORVASTATIN CALCIUM 10 MG PO TABS: 10.0000 mg | ORAL_TABLET | Freq: Every day | ORAL | 3 refills | Status: DC

## 2016-11-05 MED ORDER — SITAGLIPTIN PHOSPHATE 100 MG PO TABS: 100.0000 mg | ORAL_TABLET | Freq: Every day | ORAL | 1 refills | Status: DC

## 2016-11-05 NOTE — Progress Notes (Signed)
Date:  11/05/2016   Name:  Meagan Ibarra   DOB:  May 12, 1960   MRN:  [id number]   Chief Complaint: Diabetes Diabetes  She presents for her follow-up diabetic visit. She has type 2 diabetes mellitus. Her disease course has been stable. Pertinent negatives for hypoglycemia include no headaches or tremors. Pertinent negatives for diabetes include no chest pain, no fatigue, no polydipsia and no polyuria. Current diabetic treatment includes oral agent (dual therapy). She is compliant with treatment most of the time. An ACE inhibitor/angiotensin II receptor blocker is being taken. Eye exam is current.  Hypertension  This is a chronic problem. The problem is controlled. Pertinent negatives include no chest pain, headaches, palpitations or shortness of breath.  Hyperlipidemia  This is a chronic problem. Exacerbating diseases include diabetes and liver disease. Pertinent negatives include no chest pain or shortness of breath. She is currently on no antihyperlipidemic treatment (but did tolerated atorvastatin 10 mg during the study).      Review of Systems  Constitutional: Negative for appetite change, fatigue, fever and unexpected weight change.  HENT: Negative for tinnitus and trouble swallowing.   Eyes: Negative for visual disturbance.  Respiratory: Negative for cough, chest tightness and shortness of breath.   Cardiovascular: Negative for chest pain, palpitations and leg swelling.  Gastrointestinal: Negative for abdominal pain, diarrhea and vomiting.  Endocrine: Negative for polydipsia and polyuria.  Genitourinary: Negative for dysuria and hematuria.  Musculoskeletal: Negative for arthralgias.  Skin: Negative for rash and wound.  Neurological: Negative for tremors, numbness and headaches.  Psychiatric/Behavioral: Negative for dysphoric mood and sleep disturbance.    Patient Active Problem List   Diagnosis Date Noted  . Adenomatous colon polyp 04/17/2016  . FH: CAD (coronary artery  disease) 04/17/2016  . Uncontrolled type 2 diabetes mellitus without complication, without long-term current use of insulin ([place]) 09/25/2015  . Essential (primary) hypertension 11/07/2014  . Combined fat and carbohydrate induced hyperlipemia 11/07/2014  . Cirrhosis of liver with ascites ([place]) 03/22/2014  . Fibroid 10/03/2013  . Cyst of right ovary 10/03/2013    Prior to Admission medications   Medication Sig Start Date End Date Taking? Authorizing Provider  amLODipine (NORVASC) 10 MG tablet Take 1 tablet (10 mg total) by mouth daily. 04/17/16  Yes Glean Hess, MD  JANUVIA 100 MG tablet TAKE ONE TABLET BY MOUTH ONCE DAILY 10/19/16  Yes Glean Hess, MD  lisinopril-hydrochlorothiazide (PRINZIDE,ZESTORETIC) 20-12.5 MG tablet Take 1 tablet by mouth 2 (two) times daily. 07/15/16  Yes Glean Hess, MD  metFORMIN (GLUCOPHAGE) 500 MG tablet Take 1 tablet (500 mg total) by mouth 2 (two) times daily with a meal. 04/17/16  Yes Glean Hess, MD  Norgestimate-Ethinyl Estradiol Triphasic (TRI-SPRINTEC) 0.18/0.215/0.25 MG-35 MCG tablet Take 1 tablet by mouth daily.   Yes [provider]    Allergies  Allergen Reactions  . Invokana [Canagliflozin] Itching    Past Surgical History:  Procedure Laterality Date  . BREAST REDUCTION SURGERY  1980  . BREAST SURGERY    . COLONOSCOPY WITH PROPOFOL N/A 12/31/2014   19 polyps found  . COLONOSCOPY WITH PROPOFOL N/A 06/05/2016   Procedure: COLONOSCOPY WITH PROPOFOL;  Surgeon: Lollie Sails, MD;  Location: [facility] ENDOSCOPY;  Service: Endoscopy;  Laterality: N/A;    Social History  Substance Use Topics  . Smoking status: Former Research scientist (life sciences)  . Smokeless tobacco: Never Used  . Alcohol use No     Medication list has been reviewed and updated.  Physical Exam  Constitutional: She is oriented to person, place, and time. She appears well-developed. No distress.  HENT:  Head: Normocephalic and atraumatic.  Neck: Normal range of motion.  Neck supple. Carotid bruit is not present. No thyromegaly present.  Cardiovascular: Normal rate, regular rhythm and normal heart sounds.   Pulmonary/Chest: Effort normal. No respiratory distress. She has no wheezes.  Musculoskeletal: Normal range of motion. She exhibits edema (trace ankle).  Neurological: She is alert and oriented to person, place, and time.  Skin: Skin is warm and dry. No rash noted.  Psychiatric: She has a normal mood and affect. Her speech is normal and behavior is normal. Thought content normal.  Nursing note and vitals reviewed.   BP 118/76   Pulse 83   Ht 5\' 7"  (1.702 m)   Wt 223 lb (101.2 kg)   LMP 10/15/2016 (Within Weeks)   SpO2 98%   BMI 34.93 kg/m   Assessment and Plan: 1. Essential (primary) hypertension controlled - lisinopril-hydrochlorothiazide (PRINZIDE,ZESTORETIC) 20-12.5 MG tablet; Take 1 tablet by mouth 2 (two) times daily.  Dispense: 180 tablet; Refill: 1  2. Cirrhosis of liver with ascites, unspecified hepatic cirrhosis type ([place]) Follow up with liver specialist - Comprehensive metabolic panel  3. Uncontrolled type 2 diabetes mellitus without complication, without long-term current use of insulin ([facility]) Continue current medications Resume exercise and work on diet - Hemoglobin A1c - sitaGLIPtin (JANUVIA) 100 MG tablet; Take 1 tablet (100 mg total) by mouth daily.  Dispense: 90 tablet; Refill: 1  4. Combined fat and carbohydrate induced hyperlipemia Resume statin therapy - Lipid panel - atorvastatin (LIPITOR) 10 MG tablet; Take 1 tablet (10 mg total) by mouth daily.  Dispense: 90 tablet; Refill: 3   Meds ordered this encounter  Medications  . lisinopril-hydrochlorothiazide (PRINZIDE,ZESTORETIC) 20-12.5 MG tablet    Sig: Take 1 tablet by mouth 2 (two) times daily.    Dispense:  180 tablet    Refill:  1  . sitaGLIPtin (JANUVIA) 100 MG tablet    Sig: Take 1 tablet (100 mg total) by mouth daily.    Dispense:  90 tablet    Refill:  1     Please consider 90 day supplies to promote better adherence  . atorvastatin (LIPITOR) 10 MG tablet    Sig: Take 1 tablet (10 mg total) by mouth daily.    Dispense:  90 tablet    Refill:  [place], MD [facility]  11/05/2016

## 2016-11-06 LAB — COMPREHENSIVE METABOLIC PANEL
ALT: 61 IU/L — ABNORMAL HIGH (ref 0–32)
AST: 57 IU/L — ABNORMAL HIGH (ref 0–40)
Albumin/Globulin Ratio: 1 — ABNORMAL LOW (ref 1.2–2.2)
Albumin: 3.6 g/dL (ref 3.5–5.5)
Alkaline Phosphatase: 105 IU/L (ref 39–117)
BUN/Creatinine Ratio: 12 (ref 9–23)
BUN: 10 mg/dL (ref 6–24)
Bilirubin Total: 0.4 mg/dL (ref 0.0–1.2)
CO2: 25 mmol/L (ref 20–29)
Calcium: 9.2 mg/dL (ref 8.7–10.2)
Chloride: 95 mmol/L — ABNORMAL LOW (ref 96–106)
Creatinine, Ser: 0.86 mg/dL (ref 0.57–1.00)
GFR calc Af Amer: 89 mL/min/{1.73_m2} (ref >59)
GFR calc non Af Amer: 77 mL/min/{1.73_m2} (ref >59)
Globulin, Total: 3.6 g/dL (ref 1.5–4.5)
Glucose: 185 mg/dL — ABNORMAL HIGH (ref 65–99)
Potassium: 3.5 mmol/L (ref 3.5–5.2)
Sodium: 137 mmol/L (ref 134–144)
Total Protein: 7.2 g/dL (ref 6.0–8.5)

## 2016-11-06 LAB — LIPID PANEL
Chol/HDL Ratio: 3 ratio (ref 0.0–4.4)
Cholesterol, Total: 200 mg/dL — ABNORMAL HIGH (ref 100–199)
HDL: 66 mg/dL (ref >39)
LDL Calculated: 108 mg/dL — ABNORMAL HIGH (ref 0–99)
Triglycerides: 131 mg/dL (ref 0–149)
VLDL Cholesterol Cal: 26 mg/dL (ref 5–40)

## 2016-11-06 LAB — HEMOGLOBIN A1C
Est. average glucose Bld gHb Est-mCnc: 200 mg/dL
Hgb A1c MFr Bld: 8.6 % — ABNORMAL HIGH (ref 4.8–5.6)

## 2016-11-24 DIAGNOSIS — N898 Other specified noninflammatory disorders of vagina: Secondary | ICD-10-CM | POA: Diagnosis not present

## 2016-11-24 DIAGNOSIS — Z01419 Encounter for gynecological examination (general) (routine) without abnormal findings: Secondary | ICD-10-CM | POA: Diagnosis not present

## 2016-11-24 DIAGNOSIS — R1084 Generalized abdominal pain: Secondary | ICD-10-CM | POA: Diagnosis not present

## 2016-11-24 DIAGNOSIS — Z1211 Encounter for screening for malignant neoplasm of colon: Secondary | ICD-10-CM | POA: Diagnosis not present

## 2016-11-24 DIAGNOSIS — R140 Abdominal distension (gaseous): Secondary | ICD-10-CM | POA: Diagnosis not present

## 2016-12-22 DIAGNOSIS — K7469 Other cirrhosis of liver: Secondary | ICD-10-CM | POA: Diagnosis not present

## 2017-01-06 DIAGNOSIS — K7469 Other cirrhosis of liver: Secondary | ICD-10-CM | POA: Diagnosis not present

## 2017-01-06 DIAGNOSIS — K760 Fatty (change of) liver, not elsewhere classified: Secondary | ICD-10-CM | POA: Diagnosis not present

## 2017-01-06 DIAGNOSIS — K7581 Nonalcoholic steatohepatitis (NASH): Secondary | ICD-10-CM | POA: Diagnosis not present

## 2017-01-06 DIAGNOSIS — K7460 Unspecified cirrhosis of liver: Secondary | ICD-10-CM | POA: Diagnosis not present

## 2017-01-08 ENCOUNTER — Ambulatory Visit (INDEPENDENT_AMBULATORY_CARE_PROVIDER_SITE_OTHER): Payer: BLUE CROSS/BLUE SHIELD | Admitting: Internal Medicine

## 2017-01-08 ENCOUNTER — Encounter: Payer: Self-pay | Admitting: Internal Medicine

## 2017-01-08 VITALS — BP 112/70 | HR 84 | Ht 67.0 in | Wt 219.0 lb

## 2017-01-08 DIAGNOSIS — E1165 Type 2 diabetes mellitus with hyperglycemia: Secondary | ICD-10-CM | POA: Diagnosis not present

## 2017-01-08 DIAGNOSIS — IMO0001 Reserved for inherently not codable concepts without codable children: Secondary | ICD-10-CM

## 2017-01-08 MED ORDER — SEMAGLUTIDE(0.25 OR 0.5MG/DOS) 2 MG/1.5ML ~~LOC~~ SOPN: 0.5000 mg | PEN_INJECTOR | SUBCUTANEOUS | 5 refills | Status: DC

## 2017-01-08 NOTE — Progress Notes (Signed)
Date:  01/08/2017   Name:  Meagan Ibarra   DOB:  Nov 08, 1960   MRN:  [id number]   Chief Complaint: Diabetes (BS- this morning 280 - 11AM- was 303. Last night at 10PM- 579. )      Diabetes  She presents for her follow-up diabetic visit. She has type 2 diabetes mellitus. Her disease course has been worsening. Pertinent negatives for hypoglycemia include no dizziness or headaches. Associated symptoms include blurred vision and fatigue. Pertinent negatives for diabetes include no chest pain, no foot ulcerations, no polyuria and no weakness. Current diabetic treatment includes oral agent (dual therapy). She is compliant with treatment all of the time. Her weight is stable. She is following a generally healthy diet. Her home blood glucose trend is increasing rapidly. Her breakfast blood glucose is taken between 6-7 am. Her breakfast blood glucose range is generally >200 mg/dl. Her dinner blood glucose is taken between 5-6 pm. Her dinner blood glucose range is generally >200 mg/dl.   Lab Results  Component Value Date   HGBA1C 8.6 (H) 11/05/2016     Review of Systems  Constitutional: Positive for fatigue. Negative for unexpected weight change.  Eyes: Positive for blurred vision and visual disturbance.  Respiratory: Negative for chest tightness and shortness of breath.   Cardiovascular: Negative for chest pain.  Endocrine: Negative for polyuria.  Genitourinary: Negative for dysuria and hematuria.  Neurological: Negative for dizziness, weakness and headaches.    Patient Active Problem List   Diagnosis Date Noted  . Adenomatous colon polyp 04/17/2016  . FH: CAD (coronary artery disease) 04/17/2016  . Uncontrolled type 2 diabetes mellitus without complication, without long-term current use of insulin ([place]) 09/25/2015  . Essential (primary) hypertension 11/07/2014  . Combined fat and carbohydrate induced hyperlipemia 11/07/2014  . Cirrhosis of liver with ascites ([place]) 03/22/2014  .  Fibroid 10/03/2013  . Cyst of right ovary 10/03/2013    Prior to Admission medications   Medication Sig Start Date End Date Taking? Authorizing Provider  amLODipine (NORVASC) 10 MG tablet Take 1 tablet (10 mg total) by mouth daily. 04/17/16  Yes Glean Hess, MD  atorvastatin (LIPITOR) 10 MG tablet Take 1 tablet (10 mg total) by mouth daily. 11/05/16  Yes Glean Hess, MD  lisinopril-hydrochlorothiazide (PRINZIDE,ZESTORETIC) 20-12.5 MG tablet Take 1 tablet by mouth 2 (two) times daily. 11/05/16  Yes Glean Hess, MD  metFORMIN (GLUCOPHAGE) 500 MG tablet Take 1 tablet (500 mg total) by mouth 2 (two) times daily with a meal. 04/17/16  Yes Glean Hess, MD  sitaGLIPtin (JANUVIA) 100 MG tablet Take 1 tablet (100 mg total) by mouth daily. 11/05/16  Yes Glean Hess, MD    Allergies  Allergen Reactions  . Invokana [Canagliflozin] Itching    Past Surgical History:  Procedure Laterality Date  . BREAST REDUCTION SURGERY  1980  . BREAST SURGERY    . COLONOSCOPY WITH PROPOFOL N/A 12/31/2014   19 polyps found  . COLONOSCOPY WITH PROPOFOL N/A 06/05/2016   Procedure: COLONOSCOPY WITH PROPOFOL;  Surgeon: Lollie Sails, MD;  Location: [facility] ENDOSCOPY;  Service: Endoscopy;  Laterality: N/A;    Social History  Substance Use Topics  . Smoking status: Former Research scientist (life sciences)  . Smokeless tobacco: Never Used  . Alcohol use No     Medication list has been reviewed and updated.  PHQ 2/9 Scores 01/08/2017 09/25/2015  PHQ - 2 Score 0 0    Physical Exam  Constitutional: She appears well-developed and well-nourished.  Neck: Normal range of motion. No thyromegaly present.  Cardiovascular: Normal rate, regular rhythm and normal heart sounds.   Pulmonary/Chest: Effort normal and breath sounds normal. No respiratory distress.    BP 112/70   Pulse 84   Ht 5\' 7"  (1.702 m)   Wt 219 lb (99.3 kg)   LMP 11/30/2016 Comment: went off pill 11/25/2016  SpO2 99%   BMI 34.30 kg/m   Assessment  and Plan: 1. Uncontrolled type 2 diabetes mellitus without complication, without long-term current use of insulin (Meagan Ibarra) Begin Ozempic - sample given Pt instructed in use and administered her first dose here in the office. Return in 2 months   Meds ordered this encounter  Medications  . Semaglutide (OZEMPIC) 0.25 or 0.5 MG/DOSE SOPN    Sig: Inject 0.5 mg into the skin once a week.    Dispense:  1.5 mL    Refill:  5    Partially dictated using Editor, commissioning. Any errors are unintentional.  Halina Maidens, MD [facility]  01/08/2017

## 2017-01-08 NOTE — Patient Instructions (Signed)
Ozempic injection once a week.  Start with 0.25 mg weekly for 4 weeks then increase to 0.5 mg weekly.    Remain at 0.5 mg weekly until next visit.

## 2017-01-12 DIAGNOSIS — D259 Leiomyoma of uterus, unspecified: Secondary | ICD-10-CM | POA: Diagnosis not present

## 2017-01-12 DIAGNOSIS — R140 Abdominal distension (gaseous): Secondary | ICD-10-CM | POA: Diagnosis not present

## 2017-01-20 DIAGNOSIS — E119 Type 2 diabetes mellitus without complications: Secondary | ICD-10-CM | POA: Diagnosis not present

## 2017-02-09 DIAGNOSIS — Z23 Encounter for immunization: Secondary | ICD-10-CM | POA: Diagnosis not present

## 2017-03-03 ENCOUNTER — Ambulatory Visit (INDEPENDENT_AMBULATORY_CARE_PROVIDER_SITE_OTHER): Payer: BLUE CROSS/BLUE SHIELD | Admitting: Internal Medicine

## 2017-03-03 ENCOUNTER — Encounter: Payer: Self-pay | Admitting: Internal Medicine

## 2017-03-03 VITALS — BP 116/68 | HR 84 | Ht 67.0 in | Wt 215.0 lb

## 2017-03-03 DIAGNOSIS — E1169 Type 2 diabetes mellitus with other specified complication: Secondary | ICD-10-CM

## 2017-03-03 DIAGNOSIS — E785 Hyperlipidemia, unspecified: Secondary | ICD-10-CM | POA: Diagnosis not present

## 2017-03-03 DIAGNOSIS — M25512 Pain in left shoulder: Secondary | ICD-10-CM | POA: Diagnosis not present

## 2017-03-03 DIAGNOSIS — E1165 Type 2 diabetes mellitus with hyperglycemia: Secondary | ICD-10-CM

## 2017-03-03 DIAGNOSIS — I10 Essential (primary) hypertension: Secondary | ICD-10-CM

## 2017-03-03 DIAGNOSIS — IMO0001 Reserved for inherently not codable concepts without codable children: Secondary | ICD-10-CM

## 2017-03-03 DIAGNOSIS — M25511 Pain in right shoulder: Secondary | ICD-10-CM

## 2017-03-03 DIAGNOSIS — G8929 Other chronic pain: Secondary | ICD-10-CM | POA: Diagnosis not present

## 2017-03-03 MED ORDER — SEMAGLUTIDE (1 MG/DOSE) 2 MG/1.5ML ~~LOC~~ SOPN: 1.0000 mg | PEN_INJECTOR | SUBCUTANEOUS | 5 refills | Status: DC

## 2017-03-03 NOTE — Progress Notes (Signed)
Date:  03/03/2017   Name:  Meagan Ibarra   DOB:  1960/08/11   MRN:  [id number]   Chief Complaint: Diabetes (BS- 217 fasting. And after lunch 201) and Hypertension Diabetes  She presents for her follow-up diabetic visit. She has type 2 diabetes mellitus. Her disease course has been stable. Pertinent negatives for hypoglycemia include no headaches or tremors. Pertinent negatives for diabetes include no chest pain, no fatigue, no polydipsia and no polyuria. Current diabetic treatments: metformin, januvia and ozempic.  Hypertension  This is a chronic problem. The problem is unchanged. The problem is controlled. Pertinent negatives include no chest pain, headaches, palpitations or shortness of breath.  Shoulder pain - left shoulder and upper arm muscles are painful and ROM is very limited.   Lab Results  Component Value Date   HGBA1C 8.6 (H) 11/05/2016   Lab Results  Component Value Date   CREATININE 0.86 11/05/2016   BUN 10 11/05/2016   NA 137 11/05/2016   K 3.5 11/05/2016   CL 95 (L) 11/05/2016   CO2 25 11/05/2016     Review of Systems  Constitutional: Negative for appetite change, fatigue, fever and unexpected weight change.  HENT: Negative for tinnitus and trouble swallowing.   Eyes: Negative for visual disturbance.  Respiratory: Negative for cough, chest tightness and shortness of breath.   Cardiovascular: Negative for chest pain, palpitations and leg swelling.  Gastrointestinal: Negative for abdominal pain.  Endocrine: Negative for polydipsia and polyuria.  Genitourinary: Negative for dysuria and hematuria.  Musculoskeletal: Positive for arthralgias and myalgias.  Neurological: Negative for tremors, numbness and headaches.  Psychiatric/Behavioral: Negative for dysphoric mood.    Patient Active Problem List   Diagnosis Date Noted  . Adenomatous colon polyp 04/17/2016  . FH: CAD (coronary artery disease) 04/17/2016  . Uncontrolled type 2 diabetes mellitus without  complication, without long-term current use of insulin ([place]) 09/25/2015  . Essential (primary) hypertension 11/07/2014  . Hyperlipidemia associated with type 2 diabetes mellitus ([place]) 11/07/2014  . Cirrhosis of liver with ascites ([place]) 03/22/2014  . Fibroid 10/03/2013  . Cyst of right ovary 10/03/2013    Prior to Admission medications   Medication Sig Start Date End Date Taking? Authorizing Provider  amLODipine (NORVASC) 10 MG tablet Take 1 tablet (10 mg total) by mouth daily. 04/17/16   Glean Hess, MD  atorvastatin (LIPITOR) 10 MG tablet Take 1 tablet (10 mg total) by mouth daily. 11/05/16   Glean Hess, MD  lisinopril-hydrochlorothiazide (PRINZIDE,ZESTORETIC) 20-12.5 MG tablet Take 1 tablet by mouth 2 (two) times daily. 11/05/16   Glean Hess, MD  metFORMIN (GLUCOPHAGE) 500 MG tablet Take 1 tablet (500 mg total) by mouth 2 (two) times daily with a meal. 04/17/16   Glean Hess, MD  Semaglutide (OZEMPIC) 0.25 or 0.5 MG/DOSE SOPN Inject 0.5 mg into the skin once a week. 01/08/17   Glean Hess, MD  sitaGLIPtin (JANUVIA) 100 MG tablet Take 1 tablet (100 mg total) by mouth daily. 11/05/16   Glean Hess, MD    Allergies  Allergen Reactions  . Invokana [Canagliflozin] Itching    Past Surgical History:  Procedure Laterality Date  . BREAST REDUCTION SURGERY  1980  . BREAST SURGERY    . COLONOSCOPY WITH PROPOFOL N/A 12/31/2014   19 polyps found  . COLONOSCOPY WITH PROPOFOL N/A 06/05/2016   Procedure: COLONOSCOPY WITH PROPOFOL;  Surgeon: Lollie Sails, MD;  Location: [facility] ENDOSCOPY;  Service: Endoscopy;  Laterality: N/A;  Social History  Substance Use Topics  . Smoking status: Former Research scientist (life sciences)  . Smokeless tobacco: Never Used  . Alcohol use No     Medication list has been reviewed and updated.  PHQ 2/9 Scores 01/08/2017 09/25/2015  PHQ - 2 Score 0 0    Physical Exam  Constitutional: She is oriented to person, place, and time. She appears  well-developed. No distress.  HENT:  Head: Normocephalic and atraumatic.  Cardiovascular: Normal rate, regular rhythm and normal heart sounds.   Pulmonary/Chest: Effort normal and breath sounds normal. No respiratory distress.  Musculoskeletal:       Left shoulder: She exhibits decreased range of motion, tenderness and decreased strength.  Neurological: She is alert and oriented to person, place, and time.  Skin: Skin is warm and dry. No rash noted.  Psychiatric: She has a normal mood and affect. Her behavior is normal. Thought content normal.  Nursing note and vitals reviewed.   BP 116/68   Pulse 84   Ht 5\' 7"  (1.702 m)   Wt 215 lb (97.5 kg)   LMP 11/30/2016 (Exact Date)   SpO2 97%   BMI 33.67 kg/m   Assessment and Plan: 1. Essential (primary) hypertension controlled  2. Uncontrolled type 2 diabetes mellitus without complication, without long-term current use of insulin ([facility]) Increase Ozempic to 1 mg weekly - Hemoglobin R4W - Basic metabolic panel - Semaglutide (OZEMPIC) 1 MG/DOSE SOPN; Inject 1 mg into the skin once a week.  Dispense: 4 mL; Refill: 5  3. Hyperlipidemia associated with type 2 diabetes mellitus ([place]) Controlled on statin therapy  4. Chronic left shoulder pain - Ambulatory referral to Orthopedic Surgery   Meds ordered this encounter  Medications  . Semaglutide (OZEMPIC) 1 MG/DOSE SOPN    Sig: Inject 1 mg into the skin once a week.    Dispense:  4 mL    Refill:  5    Partially dictated using Editor, commissioning. Any errors are unintentional.  Halina Maidens, MD [facility]  03/03/2017

## 2017-03-04 LAB — BASIC METABOLIC PANEL
BUN/Creatinine Ratio: 10 (ref 9–23)
BUN: 16 mg/dL (ref 6–24)
CO2: 27 mmol/L (ref 20–29)
Calcium: 9.7 mg/dL (ref 8.7–10.2)
Chloride: 97 mmol/L (ref 96–106)
Creatinine, Ser: 1.57 mg/dL — ABNORMAL HIGH (ref 0.57–1.00)
GFR calc Af Amer: 43 mL/min/{1.73_m2} — ABNORMAL LOW (ref >59)
GFR calc non Af Amer: 37 mL/min/{1.73_m2} — ABNORMAL LOW (ref >59)
Glucose: 144 mg/dL — ABNORMAL HIGH (ref 65–99)
Potassium: 3.9 mmol/L (ref 3.5–5.2)
Sodium: 141 mmol/L (ref 134–144)

## 2017-03-04 LAB — HEMOGLOBIN A1C
Est. average glucose Bld gHb Est-mCnc: 183 mg/dL
Hgb A1c MFr Bld: 8 % — ABNORMAL HIGH (ref 4.8–5.6)

## 2017-03-09 DIAGNOSIS — G8929 Other chronic pain: Secondary | ICD-10-CM | POA: Diagnosis not present

## 2017-03-09 DIAGNOSIS — E669 Obesity, unspecified: Secondary | ICD-10-CM | POA: Diagnosis not present

## 2017-03-09 DIAGNOSIS — E119 Type 2 diabetes mellitus without complications: Secondary | ICD-10-CM | POA: Diagnosis not present

## 2017-03-09 DIAGNOSIS — M25512 Pain in left shoulder: Secondary | ICD-10-CM | POA: Diagnosis not present

## 2017-03-09 DIAGNOSIS — M7502 Adhesive capsulitis of left shoulder: Secondary | ICD-10-CM | POA: Diagnosis not present

## 2017-03-09 DIAGNOSIS — M7552 Bursitis of left shoulder: Secondary | ICD-10-CM | POA: Diagnosis not present

## 2017-03-15 DIAGNOSIS — M6281 Muscle weakness (generalized): Secondary | ICD-10-CM | POA: Diagnosis not present

## 2017-03-15 DIAGNOSIS — M25612 Stiffness of left shoulder, not elsewhere classified: Secondary | ICD-10-CM | POA: Diagnosis not present

## 2017-03-15 DIAGNOSIS — M25512 Pain in left shoulder: Secondary | ICD-10-CM | POA: Diagnosis not present

## 2017-03-15 DIAGNOSIS — M7502 Adhesive capsulitis of left shoulder: Secondary | ICD-10-CM | POA: Diagnosis not present

## 2017-03-17 DIAGNOSIS — M25512 Pain in left shoulder: Secondary | ICD-10-CM | POA: Diagnosis not present

## 2017-03-17 DIAGNOSIS — M7502 Adhesive capsulitis of left shoulder: Secondary | ICD-10-CM | POA: Diagnosis not present

## 2017-03-17 DIAGNOSIS — M25612 Stiffness of left shoulder, not elsewhere classified: Secondary | ICD-10-CM | POA: Diagnosis not present

## 2017-03-17 DIAGNOSIS — M6281 Muscle weakness (generalized): Secondary | ICD-10-CM | POA: Diagnosis not present

## 2017-03-19 DIAGNOSIS — M25612 Stiffness of left shoulder, not elsewhere classified: Secondary | ICD-10-CM | POA: Diagnosis not present

## 2017-03-19 DIAGNOSIS — M7502 Adhesive capsulitis of left shoulder: Secondary | ICD-10-CM | POA: Diagnosis not present

## 2017-03-19 DIAGNOSIS — M25512 Pain in left shoulder: Secondary | ICD-10-CM | POA: Diagnosis not present

## 2017-03-19 DIAGNOSIS — M6281 Muscle weakness (generalized): Secondary | ICD-10-CM | POA: Diagnosis not present

## 2017-03-25 DIAGNOSIS — M6281 Muscle weakness (generalized): Secondary | ICD-10-CM | POA: Diagnosis not present

## 2017-03-25 DIAGNOSIS — M7502 Adhesive capsulitis of left shoulder: Secondary | ICD-10-CM | POA: Diagnosis not present

## 2017-03-25 DIAGNOSIS — M25612 Stiffness of left shoulder, not elsewhere classified: Secondary | ICD-10-CM | POA: Diagnosis not present

## 2017-03-25 DIAGNOSIS — M25512 Pain in left shoulder: Secondary | ICD-10-CM | POA: Diagnosis not present

## 2017-03-29 DIAGNOSIS — M25512 Pain in left shoulder: Secondary | ICD-10-CM | POA: Diagnosis not present

## 2017-03-29 DIAGNOSIS — M6281 Muscle weakness (generalized): Secondary | ICD-10-CM | POA: Diagnosis not present

## 2017-03-29 DIAGNOSIS — M25612 Stiffness of left shoulder, not elsewhere classified: Secondary | ICD-10-CM | POA: Diagnosis not present

## 2017-03-29 DIAGNOSIS — M7502 Adhesive capsulitis of left shoulder: Secondary | ICD-10-CM | POA: Diagnosis not present

## 2017-04-08 ENCOUNTER — Telehealth: Payer: Self-pay | Admitting: Internal Medicine

## 2017-04-08 NOTE — Telephone Encounter (Signed)
Pt is not an established patient at [facility]. Patient would need to set up an appointment.

## 2017-04-08 NOTE — Telephone Encounter (Signed)
Stating to wheeze a lot and inhaler has not been making a differece Would like to discuss other options Patient has to sleep before work soon Please leave a message if unable to contact or gave permission to speak with daughter Mendel Ryder [phone]

## 2017-04-08 NOTE — Telephone Encounter (Signed)
Lmov for patient to call back and schedule appointment ° ° °

## 2017-04-09 DIAGNOSIS — M7502 Adhesive capsulitis of left shoulder: Secondary | ICD-10-CM | POA: Diagnosis not present

## 2017-04-09 DIAGNOSIS — E669 Obesity, unspecified: Secondary | ICD-10-CM | POA: Diagnosis not present

## 2017-04-09 DIAGNOSIS — E119 Type 2 diabetes mellitus without complications: Secondary | ICD-10-CM | POA: Diagnosis not present

## 2017-04-14 DIAGNOSIS — M7502 Adhesive capsulitis of left shoulder: Secondary | ICD-10-CM | POA: Diagnosis not present

## 2017-04-14 DIAGNOSIS — M25612 Stiffness of left shoulder, not elsewhere classified: Secondary | ICD-10-CM | POA: Diagnosis not present

## 2017-04-14 DIAGNOSIS — M25512 Pain in left shoulder: Secondary | ICD-10-CM | POA: Diagnosis not present

## 2017-04-14 DIAGNOSIS — M6281 Muscle weakness (generalized): Secondary | ICD-10-CM | POA: Diagnosis not present

## 2017-04-14 NOTE — Telephone Encounter (Signed)
documented in wrong chart.   Being corrected.

## 2017-04-16 DIAGNOSIS — M25612 Stiffness of left shoulder, not elsewhere classified: Secondary | ICD-10-CM | POA: Diagnosis not present

## 2017-04-16 DIAGNOSIS — M25512 Pain in left shoulder: Secondary | ICD-10-CM | POA: Diagnosis not present

## 2017-04-16 DIAGNOSIS — M6281 Muscle weakness (generalized): Secondary | ICD-10-CM | POA: Diagnosis not present

## 2017-04-16 DIAGNOSIS — M7502 Adhesive capsulitis of left shoulder: Secondary | ICD-10-CM | POA: Diagnosis not present

## 2017-04-21 DIAGNOSIS — M25612 Stiffness of left shoulder, not elsewhere classified: Secondary | ICD-10-CM | POA: Diagnosis not present

## 2017-04-21 DIAGNOSIS — M6281 Muscle weakness (generalized): Secondary | ICD-10-CM | POA: Diagnosis not present

## 2017-04-21 DIAGNOSIS — M25512 Pain in left shoulder: Secondary | ICD-10-CM | POA: Diagnosis not present

## 2017-04-21 DIAGNOSIS — M7502 Adhesive capsulitis of left shoulder: Secondary | ICD-10-CM | POA: Diagnosis not present

## 2017-05-13 ENCOUNTER — Other Ambulatory Visit: Payer: Self-pay | Admitting: Internal Medicine

## 2017-05-13 DIAGNOSIS — E1165 Type 2 diabetes mellitus with hyperglycemia: Principal | ICD-10-CM

## 2017-05-13 DIAGNOSIS — IMO0001 Reserved for inherently not codable concepts without codable children: Secondary | ICD-10-CM

## 2017-05-13 DIAGNOSIS — I10 Essential (primary) hypertension: Secondary | ICD-10-CM

## 2017-05-13 DIAGNOSIS — M7502 Adhesive capsulitis of left shoulder: Secondary | ICD-10-CM | POA: Diagnosis not present

## 2017-05-13 DIAGNOSIS — M25512 Pain in left shoulder: Secondary | ICD-10-CM | POA: Diagnosis not present

## 2017-05-13 DIAGNOSIS — E119 Type 2 diabetes mellitus without complications: Secondary | ICD-10-CM | POA: Diagnosis not present

## 2017-05-13 DIAGNOSIS — E669 Obesity, unspecified: Secondary | ICD-10-CM | POA: Diagnosis not present

## 2017-05-28 ENCOUNTER — Other Ambulatory Visit: Payer: Self-pay | Admitting: Internal Medicine

## 2017-05-28 DIAGNOSIS — I10 Essential (primary) hypertension: Secondary | ICD-10-CM

## 2017-07-06 ENCOUNTER — Ambulatory Visit (INDEPENDENT_AMBULATORY_CARE_PROVIDER_SITE_OTHER): Payer: BLUE CROSS/BLUE SHIELD | Admitting: Internal Medicine

## 2017-07-06 ENCOUNTER — Encounter: Payer: Self-pay | Admitting: Internal Medicine

## 2017-07-06 VITALS — BP 124/80 | HR 86 | Ht 67.0 in | Wt 217.0 lb

## 2017-07-06 DIAGNOSIS — Z1159 Encounter for screening for other viral diseases: Secondary | ICD-10-CM

## 2017-07-06 DIAGNOSIS — E1169 Type 2 diabetes mellitus with other specified complication: Secondary | ICD-10-CM

## 2017-07-06 DIAGNOSIS — IMO0001 Reserved for inherently not codable concepts without codable children: Secondary | ICD-10-CM

## 2017-07-06 DIAGNOSIS — K7460 Unspecified cirrhosis of liver: Secondary | ICD-10-CM

## 2017-07-06 DIAGNOSIS — R188 Other ascites: Secondary | ICD-10-CM

## 2017-07-06 DIAGNOSIS — E1165 Type 2 diabetes mellitus with hyperglycemia: Secondary | ICD-10-CM | POA: Diagnosis not present

## 2017-07-06 DIAGNOSIS — E785 Hyperlipidemia, unspecified: Secondary | ICD-10-CM

## 2017-07-06 DIAGNOSIS — I10 Essential (primary) hypertension: Secondary | ICD-10-CM

## 2017-07-06 MED ORDER — ATORVASTATIN CALCIUM 10 MG PO TABS: 10.0000 mg | ORAL_TABLET | Freq: Every day | ORAL | 3 refills | Status: DC

## 2017-07-06 MED ORDER — AMLODIPINE BESYLATE 10 MG PO TABS: 10.0000 mg | ORAL_TABLET | Freq: Every day | ORAL | 3 refills | Status: DC

## 2017-07-06 MED ORDER — LISINOPRIL-HYDROCHLOROTHIAZIDE 20-12.5 MG PO TABS: 1.0000 | ORAL_TABLET | Freq: Two times a day (BID) | ORAL | 3 refills | Status: DC

## 2017-07-06 MED ORDER — SEMAGLUTIDE (1 MG/DOSE) 2 MG/1.5ML ~~LOC~~ SOPN: 1.0000 mg | PEN_INJECTOR | SUBCUTANEOUS | 3 refills | Status: DC

## 2017-07-06 MED ORDER — METFORMIN HCL 500 MG PO TABS: 500.0000 mg | ORAL_TABLET | Freq: Two times a day (BID) | ORAL | 3 refills | Status: DC

## 2017-07-06 MED ORDER — SITAGLIPTIN PHOSPHATE 100 MG PO TABS: 100.0000 mg | ORAL_TABLET | Freq: Every day | ORAL | 3 refills | Status: DC

## 2017-07-06 NOTE — Progress Notes (Signed)
Date:  07/06/2017   Name:  Meagan Ibarra   DOB:  Jun 10, 1960   MRN:  [id number]   Chief Complaint: Diabetes and Hypertension Diabetes  She presents for her follow-up diabetic visit. She has type 2 diabetes mellitus. Her disease course has been stable. Pertinent negatives for hypoglycemia include no dizziness, headaches or tremors. Pertinent negatives for diabetes include no chest pain, no fatigue, no polydipsia and no polyuria. Current diabetic treatment includes oral agent (triple therapy) Celesta Gentile, metformin, ozempic). She is compliant with treatment most of the time. An ACE inhibitor/angiotensin II receptor blocker is being taken.  Hypertension  This is a chronic problem. The problem is controlled. Pertinent negatives include no chest pain, headaches, palpitations or shortness of breath. Past treatments include calcium channel blockers and ACE inhibitors.  Hyperlipidemia  This is a chronic problem. Pertinent negatives include no chest pain or shortness of breath. Current antihyperlipidemic treatment includes statins.   Lab Results  Component Value Date   HGBA1C 8.0 (H) 03/03/2017   Lab Results  Component Value Date   CREATININE 1.57 (H) 03/03/2017   BUN 16 03/03/2017   NA 141 03/03/2017   K 3.9 03/03/2017   CL 97 03/03/2017   CO2 27 03/03/2017   Lab Results  Component Value Date   CHOL 200 (H) 11/05/2016   HDL 66 11/05/2016   LDLCALC 108 (H) 11/05/2016   TRIG 131 11/05/2016   CHOLHDL 3.0 11/05/2016      Review of Systems  Constitutional: Negative for appetite change, fatigue, fever and unexpected weight change.  HENT: Negative for tinnitus and trouble swallowing.   Eyes: Negative for visual disturbance.  Respiratory: Negative for cough, chest tightness and shortness of breath.   Cardiovascular: Negative for chest pain, palpitations and leg swelling.  Gastrointestinal: Negative for abdominal pain.  Endocrine: Negative for polydipsia and polyuria.  Genitourinary:  Negative for dysuria and hematuria.  Musculoskeletal: Negative for arthralgias and gait problem.  Neurological: Negative for dizziness, tremors, numbness and headaches.  Psychiatric/Behavioral: Negative for dysphoric mood and sleep disturbance.    Patient Active Problem List   Diagnosis Date Noted  . Chronic right shoulder pain 03/03/2017  . Adenomatous colon polyp 04/17/2016  . FH: CAD (coronary artery disease) 04/17/2016  . Uncontrolled type 2 diabetes mellitus without complication, without long-term current use of insulin ([place]) 09/25/2015  . Essential (primary) hypertension 11/07/2014  . Hyperlipidemia associated with type 2 diabetes mellitus ([place]) 11/07/2014  . Cirrhosis of liver with ascites ([place]) 03/22/2014  . Fibroid 10/03/2013  . Cyst of right ovary 10/03/2013    Prior to Admission medications   Medication Sig Start Date End Date Taking? Authorizing Provider  amLODipine (NORVASC) 10 MG tablet TAKE 1 TABLET BY MOUTH DAILY. GENERIC EQUIVALENT FOR NORVASC 10MG . 05/29/17   Glean Hess, MD  atorvastatin (LIPITOR) 10 MG tablet Take 1 tablet (10 mg total) by mouth daily. 11/05/16   Glean Hess, MD  JANUVIA 100 MG tablet TAKE 1 TABLET BY MOUTH DAILY 05/13/17   Glean Hess, MD  lisinopril-hydrochlorothiazide (PRINZIDE,ZESTORETIC) 20-12.5 MG tablet TAKE 1 TABLET BY MOUTH TWICE DAILY 05/13/17   Glean Hess, MD  metFORMIN (GLUCOPHAGE) 500 MG tablet TAKE 1 TABLET BY MOUTH 2 TIMES DAILY WITH A MEAL 05/13/17   Glean Hess, MD  Semaglutide (OZEMPIC) 1 MG/DOSE SOPN Inject 1 mg into the skin once a week. 03/03/17   Glean Hess, MD    Allergies  Allergen Reactions  . Invokana [Canagliflozin] Itching  Past Surgical History:  Procedure Laterality Date  . BREAST REDUCTION SURGERY  1980  . BREAST SURGERY    . COLONOSCOPY WITH PROPOFOL N/A 12/31/2014   19 polyps found  . COLONOSCOPY WITH PROPOFOL N/A 06/05/2016   Procedure: COLONOSCOPY WITH PROPOFOL;  Surgeon:  Lollie Sails, MD;  Location: [facility] ENDOSCOPY;  Service: Endoscopy;  Laterality: N/A;    Social History   Tobacco Use  . Smoking status: Former Research scientist (life sciences)  . Smokeless tobacco: Never Used  Substance Use Topics  . Alcohol use: No    Alcohol/week: 0.0 oz  . Drug use: No     Medication list has been reviewed and updated.  PHQ 2/9 Scores 01/08/2017 09/25/2015  PHQ - 2 Score 0 0    Physical Exam  Constitutional: She is oriented to person, place, and time. She appears well-developed. No distress.  HENT:  Head: Normocephalic and atraumatic.  Neck: Carotid bruit is not present.  Cardiovascular: Normal rate, regular rhythm and normal heart sounds.  Pulses:      Dorsalis pedis pulses are 2+ on the right side, and 2+ on the left side.       Posterior tibial pulses are 2+ on the right side, and 2+ on the left side.  Pulmonary/Chest: Effort normal and breath sounds normal. No respiratory distress. She has no wheezes.  Musculoskeletal: Normal range of motion.  Neurological: She is alert and oriented to person, place, and time. She has normal strength. No sensory deficit.  Skin: Skin is warm and dry. No rash noted.  Psychiatric: She has a normal mood and affect. Her behavior is normal. Thought content normal.  Nursing note and vitals reviewed.   BP 124/80   Pulse 86   Ht 5\' 7"  (1.702 m)   Wt 217 lb (98.4 kg)   SpO2 97%   BMI 33.99 kg/m   Assessment and Plan: 1. Essential (primary) hypertension Controlled Has strong fam hx CAD - will refer for risk assessment - Comprehensive metabolic panel - Ambulatory referral to Cardiology - amLODipine (NORVASC) 10 MG tablet; Take 1 tablet (10 mg total) by mouth daily.  Dispense: 90 tablet; Refill: 3 - lisinopril-hydrochlorothiazide (PRINZIDE,ZESTORETIC) 20-12.5 MG tablet; Take 1 tablet by mouth 2 (two) times daily.  Dispense: 180 tablet; Refill: 3  2. Uncontrolled type 2 diabetes mellitus without complication, without long-term current use of  insulin ([place]) Continue current therapy Work on diet and exercise - Hemoglobin A1c - Microalbumin / creatinine urine ratio - Ambulatory referral to Cardiology - Semaglutide (OZEMPIC) 1 MG/DOSE SOPN; Inject 1 mg into the skin once a week.  Dispense: 12 mL; Refill: 3 - sitaGLIPtin (JANUVIA) 100 MG tablet; Take 1 tablet (100 mg total) by mouth daily.  Dispense: 90 tablet; Refill: 3 - metFORMIN (GLUCOPHAGE) 500 MG tablet; Take 1 tablet (500 mg total) by mouth 2 (two) times daily with a meal.  Dispense: 180 tablet; Refill: 3  3. Hyperlipidemia associated with type 2 diabetes mellitus ([place]) Resume statin therapy - atorvastatin (LIPITOR) 10 MG tablet; Take 1 tablet (10 mg total) by mouth daily.  Dispense: 90 tablet; Refill: 3  4. Cirrhosis of liver with ascites, unspecified hepatic cirrhosis type ([place]) Followed by GI  5. Need for hepatitis C screening test - Hepatitis C antibody    Meds ordered this encounter  Medications  . Semaglutide (OZEMPIC) 1 MG/DOSE SOPN    Sig: Inject 1 mg into the skin once a week.    Dispense:  12 mL    Refill:  3  .  amLODipine (NORVASC) 10 MG tablet    Sig: Take 1 tablet (10 mg total) by mouth daily.    Dispense:  90 tablet    Refill:  3  . sitaGLIPtin (JANUVIA) 100 MG tablet    Sig: Take 1 tablet (100 mg total) by mouth daily.    Dispense:  90 tablet    Refill:  3  . lisinopril-hydrochlorothiazide (PRINZIDE,ZESTORETIC) 20-12.5 MG tablet    Sig: Take 1 tablet by mouth 2 (two) times daily.    Dispense:  180 tablet    Refill:  3  . metFORMIN (GLUCOPHAGE) 500 MG tablet    Sig: Take 1 tablet (500 mg total) by mouth 2 (two) times daily with a meal.    Dispense:  180 tablet    Refill:  3  . atorvastatin (LIPITOR) 10 MG tablet    Sig: Take 1 tablet (10 mg total) by mouth daily.    Dispense:  90 tablet    Refill:  3    Partially dictated using Editor, commissioning. Any errors are unintentional.  Halina Maidens, MD [facility]  07/06/2017

## 2017-07-07 LAB — COMPREHENSIVE METABOLIC PANEL
ALT: 59 IU/L — ABNORMAL HIGH (ref 0–32)
AST: 67 IU/L — ABNORMAL HIGH (ref 0–40)
Albumin/Globulin Ratio: 1.1 — ABNORMAL LOW (ref 1.2–2.2)
Albumin: 4 g/dL (ref 3.5–5.5)
Alkaline Phosphatase: 128 IU/L — ABNORMAL HIGH (ref 39–117)
BUN/Creatinine Ratio: 16 (ref 9–23)
BUN: 13 mg/dL (ref 6–24)
Bilirubin Total: 1 mg/dL (ref 0.0–1.2)
CO2: 26 mmol/L (ref 20–29)
Calcium: 9.6 mg/dL (ref 8.7–10.2)
Chloride: 96 mmol/L (ref 96–106)
Creatinine, Ser: 0.83 mg/dL (ref 0.57–1.00)
GFR calc Af Amer: 92 mL/min/{1.73_m2} (ref >59)
GFR calc non Af Amer: 80 mL/min/{1.73_m2} (ref >59)
Globulin, Total: 3.8 g/dL (ref 1.5–4.5)
Glucose: 180 mg/dL — ABNORMAL HIGH (ref 65–99)
Potassium: 3.8 mmol/L (ref 3.5–5.2)
Sodium: 139 mmol/L (ref 134–144)
Total Protein: 7.8 g/dL (ref 6.0–8.5)

## 2017-07-07 LAB — HEMOGLOBIN A1C
Est. average glucose Bld gHb Est-mCnc: 171 mg/dL
Hgb A1c MFr Bld: 7.6 % — ABNORMAL HIGH (ref 4.8–5.6)

## 2017-07-07 LAB — HEPATITIS C ANTIBODY: Hep C Virus Ab: <0.1 s/co ratio (ref 0.0–0.9)

## 2017-08-17 ENCOUNTER — Encounter: Payer: Self-pay | Admitting: Cardiovascular Disease

## 2017-08-17 ENCOUNTER — Ambulatory Visit (INDEPENDENT_AMBULATORY_CARE_PROVIDER_SITE_OTHER): Payer: BLUE CROSS/BLUE SHIELD | Admitting: Cardiovascular Disease

## 2017-08-17 VITALS — BP 128/28 | HR 91 | Ht 67.0 in | Wt 217.8 lb

## 2017-08-17 DIAGNOSIS — R9431 Abnormal electrocardiogram [ECG] [EKG]: Secondary | ICD-10-CM | POA: Diagnosis not present

## 2017-08-17 DIAGNOSIS — Z7689 Persons encountering health services in other specified circumstances: Secondary | ICD-10-CM | POA: Diagnosis not present

## 2017-08-17 DIAGNOSIS — I10 Essential (primary) hypertension: Secondary | ICD-10-CM

## 2017-08-17 DIAGNOSIS — R011 Cardiac murmur, unspecified: Secondary | ICD-10-CM

## 2017-08-17 DIAGNOSIS — E785 Hyperlipidemia, unspecified: Secondary | ICD-10-CM

## 2017-08-17 NOTE — Progress Notes (Signed)
Cardiology Office Note   Date:  08/17/2017   ID:  Meagan Ibarra, DOB 1960/10/31, MRN [id number]  PCP:  Glean Hess, MD  Cardiologist:   Kathlyn Sacramento, MD   Chief Complaint  Patient presents with  . New Patient (Initial Visit)    Referred by Dr. Army Melia for family history and would like a good check up. Patient denies chest pain and SOB. Meds reviewed verbally with patient.       History of Present Illness: Meagan Ibarra is a 57 y.o. female who was referred by Dr. Army Melia for cardiovascular risk assessment.  The patient has no prior cardiac history.  She has multiple chronic medical conditions that include essential hypertension since her 57s, hyperlipidemia, type 2 diabetes, NASH and family history of coronary artery disease.  Her mother had CABG at 94.  Sister has congestive heart failure and she is in her 33s.  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  The patient denies any chest pain or shortness of breath.  She tries to walk 1 mile for exercise with no symptoms.  No palpitations, dizziness or syncope.    Past Medical History:  Diagnosis Date  . Diabetes mellitus without complication ([place])   . Hypertension   . NASH (nonalcoholic steatohepatitis)   . NASH (nonalcoholic steatohepatitis)   . Tubular adenoma of colon     Past Surgical History:  Procedure Laterality Date  . BREAST REDUCTION SURGERY  1980  . BREAST SURGERY    . COLONOSCOPY WITH PROPOFOL N/A 12/31/2014   19 polyps found  . COLONOSCOPY WITH PROPOFOL N/A 06/05/2016   Procedure: COLONOSCOPY WITH PROPOFOL;  Surgeon: Lollie Sails, MD;  Location: [facility] ENDOSCOPY;  Service: Endoscopy;  Laterality: N/A;     Current Outpatient Medications  Medication Sig Dispense Refill  . amLODipine (NORVASC) 10 MG tablet Take 1 tablet (10 mg total) by mouth daily. 90 tablet 3  . atorvastatin (LIPITOR) 10 MG tablet Take 1 tablet (10 mg total) by mouth daily. 90 tablet 3  .  lisinopril-hydrochlorothiazide (PRINZIDE,ZESTORETIC) 20-12.5 MG tablet Take 1 tablet by mouth 2 (two) times daily. 180 tablet 3  . metFORMIN (GLUCOPHAGE) 500 MG tablet Take 1 tablet (500 mg total) by mouth 2 (two) times daily with a meal. 180 tablet 3  . Semaglutide (OZEMPIC) 1 MG/DOSE SOPN Inject 1 mg into the skin once a week. 12 mL 3  . sitaGLIPtin (JANUVIA) 100 MG tablet Take 1 tablet (100 mg total) by mouth daily. 90 tablet 3   No current facility-administered medications for this visit.     Allergies:   Invokana [canagliflozin]    Social History:  The patient  reports that she has quit smoking. She has never used smokeless tobacco. She reports that she does not drink alcohol or use drugs.   Family History:  The patient's family history includes Atrial fibrillation in her sister; CAD (age of onset: 49) in her sister; CAD (age of onset: 66) in her mother; Diabetes in her father; Hypertension in her father and mother.    ROS:  Please see the history of present illness.   Otherwise, review of systems are positive for none.   All other systems are reviewed and negative.    PHYSICAL EXAM: VS:  BP (!) 128/28 (BP Location: Right Arm, Patient Position: Sitting, Cuff Size: Normal)   Pulse 91   Ht 5\' 7"  (1.702 m)   Wt 217 lb 12 oz (98.8 kg)   BMI  34.10 kg/m  , BMI Body mass index is 34.1 kg/m. GEN: Well nourished, well developed, in no acute distress  HEENT: normal  Neck: no JVD, carotid bruits, or masses Cardiac: RRR; no rubs, or gallops,no edema .  1 out of 6 systolic murmur at the base of the heart lobular at the pulmonic area. Respiratory:  clear to auscultation bilaterally, normal work of breathing GI: soft, nontender, nondistended, + BS MS: no deformity or atrophy  Skin: warm and dry, no rash Neuro:  Strength and sensation are intact Psych: euthymic mood, full affect Pulses are normal throughout.  EKG:  EKG is ordered today. The ekg ordered today demonstrates normal sinus  rhythm with possible left atrial enlargement and old septal infarct.   Recent Labs: 07/06/2017: ALT 59; BUN 13; Creatinine, Ser 0.83; Potassium 3.8; Sodium 139    Lipid Panel    Component Value Date/Time   CHOL 200 (H) 11/05/2016 0857   TRIG 131 11/05/2016 0857   HDL 66 11/05/2016 0857   CHOLHDL 3.0 11/05/2016 0857   LDLCALC 108 (H) 11/05/2016 0857      Wt Readings from Last 3 Encounters:  08/17/17 217 lb 12 oz (98.8 kg)  07/06/17 217 lb (98.4 kg)  03/03/17 215 lb (97.5 kg)        PAD Screen 08/17/2017  Previous PAD dx? No  Previous surgical procedure? No  Pain with walking? Yes  Subsides with rest? Yes  Feet/toe relief with dangling? No  Painful, non-healing ulcers? No  Extremities discolored? No      ASSESSMENT AND PLAN:  1.  Cardiac murmur and abnormal EKG: The cardiac murmur seems to be benign.  However, she has an abnormal EKG with possible left atrial enlargement and possible old septal infarct.  Due to that, I requested an echocardiogram.  She also has family history of cardiomyopathy.  2.  Atherosclerosis evaluation: The patient has multiple risk factors for coronary artery disease.  She has no symptoms suggestive of angina and thus there is limited utility for stress testing.  I think the best option is to proceed with coronary calcium score in order to help [place] determine how aggressive we need to be especially with lipid management.  3.  Essential hypertension: Blood pressure is controlled on current medications.  4.  Hyperlipidemia: Currently on atorvastatin.  Recommend a target LDL of less than 70 given that she is diabetic.  Disposition:   FU with me as needed.   Signed,  Kathlyn Sacramento, MD  08/17/2017 3:05 PM    [facility]

## 2017-08-17 NOTE — Patient Instructions (Addendum)
Medication Instructions:  Your physician recommends that you continue on your current medications as directed. Please refer to the Current Medication list given to you today.   Labwork: none  Testing/Procedures: Your physician has requested that you have an echocardiogram. Echocardiography is a painless test that uses sound waves to create images of your heart. It provides your doctor with information about the size and shape of your heart and how well your heart's chambers and valves are working. This procedure takes approximately one hour. There are no restrictions for this procedure.  CT calcium scoring [place], [place]  To schedule: call [phone] $150 payable on the day of your test.     Follow-Up: Your physician recommends that you schedule a follow-up appointment as needed.    Any Other Special Instructions Will Be Listed Below (If Applicable).     If you need a refill on your cardiac medications before your next appointment, please call your pharmacy.  Echocardiogram An echocardiogram, or echocardiography, uses sound waves (ultrasound) to produce an image of your heart. The echocardiogram is simple, painless, obtained within a short period of time, and offers valuable information to your health care provider. The images from an echocardiogram can provide information such as:  Evidence of coronary artery disease (CAD).  Heart size.  Heart muscle function.  Heart valve function.  Aneurysm detection.  Evidence of a past heart attack.  Fluid buildup around the heart.  Heart muscle thickening.  Assess heart valve function.  Tell a health care provider about:  Any allergies you have.  All medicines you are taking, including vitamins, herbs, eye drops, creams, and over-the-counter medicines.  Any problems you or family members have had with anesthetic medicines.  Any blood disorders you have.  Any surgeries you  have had.  Any medical conditions you have.  Whether you are pregnant or may be pregnant. What happens before the procedure? No special preparation is needed. Eat and drink normally. What happens during the procedure?  In order to produce an image of your heart, gel will be applied to your chest and a wand-like tool (transducer) will be moved over your chest. The gel will help transmit the sound waves from the transducer. The sound waves will harmlessly bounce off your heart to allow the heart images to be captured in real-time motion. These images will then be recorded.  You may need an IV to receive a medicine that improves the quality of the pictures. What happens after the procedure? You may return to your normal schedule including diet, activities, and medicines, unless your health care provider tells you otherwise. This information is not intended to replace advice given to you by your health care provider. Make sure you discuss any questions you have with your health care provider. Document Released: 04/24/2000 Document Revised: 12/14/2015 Document Reviewed: 01/02/2013 Elsevier Interactive Patient Education  2017 [employer].

## 2017-08-31 ENCOUNTER — Ambulatory Visit (INDEPENDENT_AMBULATORY_CARE_PROVIDER_SITE_OTHER): Payer: BLUE CROSS/BLUE SHIELD

## 2017-08-31 ENCOUNTER — Other Ambulatory Visit: Payer: Self-pay

## 2017-08-31 DIAGNOSIS — R9431 Abnormal electrocardiogram [ECG] [EKG]: Secondary | ICD-10-CM | POA: Diagnosis not present

## 2017-08-31 DIAGNOSIS — R011 Cardiac murmur, unspecified: Secondary | ICD-10-CM

## 2017-09-07 ENCOUNTER — Other Ambulatory Visit: Payer: Self-pay | Admitting: Internal Medicine

## 2017-09-07 DIAGNOSIS — IMO0001 Reserved for inherently not codable concepts without codable children: Secondary | ICD-10-CM

## 2017-09-07 DIAGNOSIS — E1165 Type 2 diabetes mellitus with hyperglycemia: Principal | ICD-10-CM

## 2017-09-21 DIAGNOSIS — K7469 Other cirrhosis of liver: Secondary | ICD-10-CM | POA: Diagnosis not present

## 2017-10-25 ENCOUNTER — Ambulatory Visit (INDEPENDENT_AMBULATORY_CARE_PROVIDER_SITE_OTHER): Payer: BLUE CROSS/BLUE SHIELD | Admitting: Internal Medicine

## 2017-10-25 ENCOUNTER — Encounter: Payer: Self-pay | Admitting: Internal Medicine

## 2017-10-25 VITALS — BP 116/78 | HR 83 | Temp 97.8°F | Resp 16 | Ht 67.0 in | Wt 214.0 lb

## 2017-10-25 DIAGNOSIS — IMO0001 Reserved for inherently not codable concepts without codable children: Secondary | ICD-10-CM

## 2017-10-25 DIAGNOSIS — E1165 Type 2 diabetes mellitus with hyperglycemia: Secondary | ICD-10-CM | POA: Diagnosis not present

## 2017-10-25 DIAGNOSIS — I10 Essential (primary) hypertension: Secondary | ICD-10-CM

## 2017-10-25 MED ORDER — INSULIN GLARGINE 100 UNIT/ML SOLOSTAR PEN: 50.0000 [IU] | PEN_INJECTOR | Freq: Every day | SUBCUTANEOUS | 99 refills | Status: DC

## 2017-10-25 MED ORDER — INSULIN PEN NEEDLE 31G X 5 MM MISC: 1.0000 | Freq: Every day | 0 refills | Status: AC

## 2017-10-25 NOTE — Patient Instructions (Signed)
Begin 10 units Lantus before supper every day.  Every 2 weeks, increase by 3-5 units until fasting BS are under 160.  Maximum dose of insulin 50 units.

## 2017-10-25 NOTE — Progress Notes (Signed)
Date:  10/25/2017   Name:  Meagan Ibarra   DOB:  10/01/1960   MRN:  [id number]   Chief Complaint: Hypertension; Emesis; and Medication Problem Hypertension  This is a chronic problem. The problem is controlled. Pertinent negatives include no chest pain, headaches, palpitations or shortness of breath. Past treatments include ACE inhibitors and calcium channel blockers. The current treatment provides significant improvement.  Diabetes  She presents for her follow-up diabetic visit. She has type 2 diabetes mellitus. Pertinent negatives for hypoglycemia include no headaches or tremors. Pertinent negatives for diabetes include no chest pain, no fatigue, no polydipsia and no polyuria. Symptoms are worsening. Current diabetic treatment includes oral agent (triple therapy) (stopped ozempic due to vomiting). Her breakfast blood glucose is taken between 6-7 am. Her breakfast blood glucose range is generally >200 mg/dl.   Lab Results  Component Value Date   HGBA1C 7.6 (H) 07/06/2017     Review of Systems  Constitutional: Negative for appetite change, fatigue, fever and unexpected weight change.  HENT: Negative for tinnitus and trouble swallowing.   Eyes: Negative for visual disturbance.  Respiratory: Negative for cough, chest tightness and shortness of breath.   Cardiovascular: Negative for chest pain, palpitations and leg swelling.  Gastrointestinal: Negative for abdominal pain.  Endocrine: Negative for polydipsia and polyuria.  Genitourinary: Negative for dysuria and hematuria.  Musculoskeletal: Negative for arthralgias.  Neurological: Negative for tremors, numbness and headaches.  Psychiatric/Behavioral: Negative for dysphoric mood.    Patient Active Problem List   Diagnosis Date Noted  . Chronic right shoulder pain 03/03/2017  . Adenomatous colon polyp 04/17/2016  . FH: CAD (coronary artery disease) 04/17/2016  . Uncontrolled type 2 diabetes mellitus without complication, without  long-term current use of insulin ([place]) 09/25/2015  . Essential (primary) hypertension 11/07/2014  . Hyperlipidemia associated with type 2 diabetes mellitus ([place]) 11/07/2014  . Cirrhosis of liver with ascites ([place]) 03/22/2014  . Fibroid 10/03/2013  . Cyst of right ovary 10/03/2013    Prior to Admission medications   Medication Sig Start Date End Date Taking? Authorizing Provider  amLODipine (NORVASC) 10 MG tablet Take 1 tablet (10 mg total) by mouth daily. 07/06/17   Glean Hess, MD  atorvastatin (LIPITOR) 10 MG tablet Take 1 tablet (10 mg total) by mouth daily. 07/06/17   Glean Hess, MD  JANUVIA 100 MG tablet TAKE 1 TABLET BY MOUTH ONCE DAILY 09/07/17   Glean Hess, MD  lisinopril-hydrochlorothiazide (PRINZIDE,ZESTORETIC) 20-12.5 MG tablet Take 1 tablet by mouth 2 (two) times daily. 07/06/17   Glean Hess, MD  metFORMIN (GLUCOPHAGE) 500 MG tablet Take 1 tablet (500 mg total) by mouth 2 (two) times daily with a meal. 07/06/17   Glean Hess, MD  Semaglutide (OZEMPIC) 1 MG/DOSE SOPN Inject 1 mg into the skin once a week. 07/06/17   Glean Hess, MD  sitaGLIPtin (JANUVIA) 100 MG tablet Take 1 tablet (100 mg total) by mouth daily. 07/06/17   Glean Hess, MD    Allergies  Allergen Reactions  . Ozempic [Semaglutide] Nausea And Vomiting  . Invokana [Canagliflozin] Itching    Past Surgical History:  Procedure Laterality Date  . BREAST REDUCTION SURGERY  1980  . BREAST SURGERY    . COLONOSCOPY WITH PROPOFOL N/A 12/31/2014   19 polyps found  . COLONOSCOPY WITH PROPOFOL N/A 06/05/2016   Procedure: COLONOSCOPY WITH PROPOFOL;  Surgeon: Lollie Sails, MD;  Location: [facility] ENDOSCOPY;  Service: Endoscopy;  Laterality: N/A;  Social History   Tobacco Use  . Smoking status: Former Research scientist (life sciences)  . Smokeless tobacco: Never Used  Substance Use Topics  . Alcohol use: No    Alcohol/week: 0.0 oz  . Drug use: No     Medication list has been reviewed and  updated.  Current Meds  Medication Sig  . amLODipine (NORVASC) 10 MG tablet Take 1 tablet (10 mg total) by mouth daily.  Marland Kitchen atorvastatin (LIPITOR) 10 MG tablet Take 1 tablet (10 mg total) by mouth daily.  Marland Kitchen lisinopril-hydrochlorothiazide (PRINZIDE,ZESTORETIC) 20-12.5 MG tablet Take 1 tablet by mouth 2 (two) times daily.  . metFORMIN (GLUCOPHAGE) 500 MG tablet Take 1 tablet (500 mg total) by mouth 2 (two) times daily with a meal.  . sitaGLIPtin (JANUVIA) 100 MG tablet Take 1 tablet (100 mg total) by mouth daily.  . [DISCONTINUED] JANUVIA 100 MG tablet TAKE 1 TABLET BY MOUTH ONCE DAILY    PHQ 2/9 Scores 01/08/2017 09/25/2015  PHQ - 2 Score 0 0    Physical Exam  Constitutional: She is oriented to person, place, and time. She appears well-developed. No distress.  HENT:  Head: Normocephalic and atraumatic.  Neck: Normal range of motion. Neck supple.  Cardiovascular: Normal rate, regular rhythm and normal heart sounds.  Pulmonary/Chest: Effort normal and breath sounds normal. No respiratory distress.  Musculoskeletal: She exhibits no edema or tenderness.  Lymphadenopathy:    She has no cervical adenopathy.  Neurological: She is alert and oriented to person, place, and time.  Skin: Skin is warm and dry. No rash noted.  Psychiatric: She has a normal mood and affect. Her behavior is normal. Thought content normal.  Nursing note and vitals reviewed.   BP 116/78   Pulse 83   Temp 97.8 F (36.6 C) (Oral)   Resp 16   Ht 5\' 7"  (1.702 m)   Wt 214 lb (97.1 kg)   SpO2 97%   BMI 33.52 kg/m   Assessment and Plan: 1. Uncontrolled type 2 diabetes mellitus without complication, without long-term current use of insulin (HCC) Stop ozempic Begin Lantus - titrate up to max 50 units or fasting BS < 160 Pt reminded to get diabetic eye exam - Insulin Glargine (LANTUS SOLOSTAR) 100 UNIT/ML Solostar Pen; Inject 50 Units into the skin daily at 10 pm.  Dispense: 5 pen; Refill: PRN - Insulin Pen Needle  (CARETOUCH PEN NEEDLES) 31G X 5 MM MISC; 1 each by Does not apply route daily.  Dispense: 100 each; Refill: 0 - Hemoglobin A1c  2. Essential (primary) hypertension controlled   Meds ordered this encounter  Medications  . Insulin Glargine (LANTUS SOLOSTAR) 100 UNIT/ML Solostar Pen    Sig: Inject 50 Units into the skin daily at 10 pm.    Dispense:  5 pen    Refill:  PRN  . Insulin Pen Needle (CARETOUCH PEN NEEDLES) 31G X 5 MM MISC    Sig: 1 each by Does not apply route daily.    Dispense:  100 each    Refill:  0    Partially dictated using Editor, commissioning. Any errors are unintentional.  Halina Maidens, MD [facility]  10/25/2017   There are no diagnoses linked to this encounter.

## 2017-10-26 LAB — HEMOGLOBIN A1C
Est. average glucose Bld gHb Est-mCnc: 249 mg/dL
Hgb A1c MFr Bld: 10.3 % — ABNORMAL HIGH (ref 4.8–5.6)

## 2017-11-05 ENCOUNTER — Encounter: Payer: BLUE CROSS/BLUE SHIELD | Admitting: Internal Medicine

## 2017-11-11 ENCOUNTER — Other Ambulatory Visit: Payer: Self-pay | Admitting: Internal Medicine

## 2017-11-11 DIAGNOSIS — IMO0001 Reserved for inherently not codable concepts without codable children: Secondary | ICD-10-CM

## 2017-11-11 DIAGNOSIS — E1165 Type 2 diabetes mellitus with hyperglycemia: Principal | ICD-10-CM

## 2017-11-30 DIAGNOSIS — M7671 Peroneal tendinitis, right leg: Secondary | ICD-10-CM | POA: Diagnosis not present

## 2017-11-30 DIAGNOSIS — M722 Plantar fascial fibromatosis: Secondary | ICD-10-CM | POA: Diagnosis not present

## 2018-02-24 ENCOUNTER — Encounter: Payer: Self-pay | Admitting: Internal Medicine

## 2018-02-24 ENCOUNTER — Ambulatory Visit (INDEPENDENT_AMBULATORY_CARE_PROVIDER_SITE_OTHER): Payer: BLUE CROSS/BLUE SHIELD | Admitting: Internal Medicine

## 2018-02-24 VITALS — BP 104/66 | HR 81 | Ht 67.0 in | Wt 214.0 lb

## 2018-02-24 DIAGNOSIS — E1165 Type 2 diabetes mellitus with hyperglycemia: Secondary | ICD-10-CM

## 2018-02-24 DIAGNOSIS — Z794 Long term (current) use of insulin: Secondary | ICD-10-CM

## 2018-02-24 DIAGNOSIS — E785 Hyperlipidemia, unspecified: Secondary | ICD-10-CM | POA: Diagnosis not present

## 2018-02-24 DIAGNOSIS — I10 Essential (primary) hypertension: Secondary | ICD-10-CM | POA: Diagnosis not present

## 2018-02-24 DIAGNOSIS — E118 Type 2 diabetes mellitus with unspecified complications: Secondary | ICD-10-CM | POA: Diagnosis not present

## 2018-02-24 DIAGNOSIS — IMO0002 Reserved for concepts with insufficient information to code with codable children: Secondary | ICD-10-CM

## 2018-02-24 DIAGNOSIS — E1169 Type 2 diabetes mellitus with other specified complication: Secondary | ICD-10-CM

## 2018-02-24 NOTE — Patient Instructions (Signed)
Please schedule you Diabetic Eye exam.

## 2018-02-24 NOTE — Progress Notes (Signed)
Date:  02/24/2018   Name:  Meagan Ibarra   DOB:  02/27/1961   MRN:  [id number]   Chief Complaint: Hypertension and Diabetes  Hypertension  The problem is controlled. Pertinent negatives include no chest pain, headaches, palpitations or shortness of breath. Past treatments include ACE inhibitors and diuretics. The current treatment provides significant improvement.  Diabetes  She presents for her follow-up diabetic visit. She has type 2 diabetes mellitus. Pertinent negatives for hypoglycemia include no headaches or tremors. Pertinent negatives for diabetes include no chest pain, no fatigue, no polydipsia and no polyuria. Current diabetic treatment includes oral agent (dual therapy) and insulin injections (metformin and januvia; insulin added last visit). She is compliant with treatment most of the time. Her weight is stable. Her breakfast blood glucose is taken between 6-7 am. Her breakfast blood glucose range is generally 180-200 mg/dl.  Hyperlipidemia  This is a chronic problem. The problem is controlled. Pertinent negatives include no chest pain or shortness of breath. Current antihyperlipidemic treatment includes statins.   She does not feel that her BS are much improved.  She has not gained any weight and is continuing to work with diet. She had a rash from invokana and severe n/v with Ozempic.  Lab Results  Component Value Date   HGBA1C 10.3 (H) 10/25/2017   Lab Results  Component Value Date   CREATININE 0.83 07/06/2017   BUN 13 07/06/2017   NA 139 07/06/2017   K 3.8 07/06/2017   CL 96 07/06/2017   CO2 26 07/06/2017   Lab Results  Component Value Date   CHOL 200 (H) 11/05/2016   HDL 66 11/05/2016   LDLCALC 108 (H) 11/05/2016   TRIG 131 11/05/2016   CHOLHDL 3.0 11/05/2016    Review of Systems  Constitutional: Negative for appetite change, fatigue, fever and unexpected weight change.  HENT: Negative for tinnitus and trouble swallowing.   Eyes: Negative for visual  disturbance.  Respiratory: Negative for cough, chest tightness and shortness of breath.   Cardiovascular: Negative for chest pain, palpitations and leg swelling.  Gastrointestinal: Negative for abdominal pain.  Endocrine: Negative for polydipsia and polyuria.  Genitourinary: Negative for dysuria and hematuria.  Musculoskeletal: Negative for arthralgias.  Allergic/Immunologic: Negative for environmental allergies.  Neurological: Negative for tremors, numbness and headaches.  Psychiatric/Behavioral: Negative for dysphoric mood.    Patient Active Problem List   Diagnosis Date Noted  . Uncontrolled type 2 diabetes mellitus with complication, with long-term current use of insulin ([place]) 02/24/2018  . Chronic right shoulder pain 03/03/2017  . Adenomatous colon polyp 04/17/2016  . FH: CAD (coronary artery disease) 04/17/2016  . Essential (primary) hypertension 11/07/2014  . Hyperlipidemia associated with type 2 diabetes mellitus ([place]) 11/07/2014  . Cirrhosis of liver with ascites ([place]) 03/22/2014  . Fibroid 10/03/2013  . Cyst of right ovary 10/03/2013    Allergies  Allergen Reactions  . Ozempic [Semaglutide] Nausea And Vomiting  . Invokana [Canagliflozin] Itching    Past Surgical History:  Procedure Laterality Date  . BREAST REDUCTION SURGERY  1980  . BREAST SURGERY    . COLONOSCOPY WITH PROPOFOL N/A 12/31/2014   19 polyps found  . COLONOSCOPY WITH PROPOFOL N/A 06/05/2016   Procedure: COLONOSCOPY WITH PROPOFOL;  Surgeon: Lollie Sails, MD;  Location: [facility] ENDOSCOPY;  Service: Endoscopy;  Laterality: N/A;    Social History   Tobacco Use  . Smoking status: Former Research scientist (life sciences)  . Smokeless tobacco: Never Used  Substance Use Topics  . Alcohol use:  No    Alcohol/week: 0.0 standard drinks  . Drug use: No     Medication list has been reviewed and updated.  Current Meds  Medication Sig  . amLODipine (NORVASC) 10 MG tablet Take 1 tablet (10 mg total) by mouth daily.  Marland Kitchen  atorvastatin (LIPITOR) 10 MG tablet Take 1 tablet (10 mg total) by mouth daily.  . Insulin Glargine (LANTUS SOLOSTAR) 100 UNIT/ML Solostar Pen Inject 50 Units into the skin daily at 10 pm. (Patient taking differently: Inject 50 Units into the skin daily at 10 pm. )  . Insulin Pen Needle (CARETOUCH PEN NEEDLES) 31G X 5 MM MISC 1 each by Does not apply route daily.  Marland Kitchen JANUVIA 100 MG tablet TAKE 1 TABLET BY MOUTH ONCE DAILY  . lisinopril-hydrochlorothiazide (PRINZIDE,ZESTORETIC) 20-12.5 MG tablet Take 1 tablet by mouth 2 (two) times daily.  . metFORMIN (GLUCOPHAGE) 500 MG tablet Take 1 tablet (500 mg total) by mouth 2 (two) times daily with a meal.    PHQ 2/9 Scores 02/24/2018 01/08/2017 09/25/2015  PHQ - 2 Score 0 0 0    Physical Exam  Constitutional: She is oriented to person, place, and time. She appears well-developed. No distress.  HENT:  Head: Normocephalic and atraumatic.  Neck: Normal range of motion. Neck supple.  Cardiovascular: Normal rate, regular rhythm and normal heart sounds.  Pulmonary/Chest: Effort normal and breath sounds normal. No respiratory distress.  Abdominal: Soft. Bowel sounds are normal.  Musculoskeletal: Normal range of motion.  Lymphadenopathy:    She has no cervical adenopathy.  Neurological: She is alert and oriented to person, place, and time.  Skin: Skin is warm and dry. No rash noted.  Psychiatric: She has a normal mood and affect. Her behavior is normal. Thought content normal.  Nursing note and vitals reviewed.   BP 104/66 (BP Location: Right Arm, Patient Position: Sitting, Cuff Size: Normal)   Pulse 81   Ht 5\' 7"  (1.702 m)   Wt 214 lb (97.1 kg)   SpO2 99%   BMI 33.52 kg/m   Assessment and Plan: 1. Uncontrolled type 2 diabetes mellitus with complication, with long-term current use of insulin ([place]) Continue current therapy for now If improved, might increase insulin - or refer to Endocrinology - Comprehensive metabolic panel - Hemoglobin  A1c  2. Essential (primary) hypertension controlled  3. Hyperlipidemia associated with type 2 diabetes mellitus ([place]) On statin therapy - Lipid panel   Partially dictated using Editor, commissioning. Any errors are unintentional.  Halina Maidens, MD [facility]  02/24/2018

## 2018-02-25 ENCOUNTER — Telehealth: Payer: Self-pay

## 2018-02-25 LAB — COMPREHENSIVE METABOLIC PANEL
ALT: 58 IU/L — ABNORMAL HIGH (ref 0–32)
AST: 60 IU/L — ABNORMAL HIGH (ref 0–40)
Albumin/Globulin Ratio: 1.1 — ABNORMAL LOW (ref 1.2–2.2)
Albumin: 3.9 g/dL (ref 3.5–5.5)
Alkaline Phosphatase: 155 IU/L — ABNORMAL HIGH (ref 39–117)
BUN/Creatinine Ratio: 15 (ref 9–23)
BUN: 12 mg/dL (ref 6–24)
Bilirubin Total: 1.4 mg/dL — ABNORMAL HIGH (ref 0.0–1.2)
CO2: 25 mmol/L (ref 20–29)
Calcium: 9.4 mg/dL (ref 8.7–10.2)
Chloride: 99 mmol/L (ref 96–106)
Creatinine, Ser: 0.8 mg/dL (ref 0.57–1.00)
GFR calc Af Amer: 96 mL/min/{1.73_m2} (ref >59)
GFR calc non Af Amer: 83 mL/min/{1.73_m2} (ref >59)
Globulin, Total: 3.7 g/dL (ref 1.5–4.5)
Glucose: 158 mg/dL — ABNORMAL HIGH (ref 65–99)
Potassium: 3.9 mmol/L (ref 3.5–5.2)
Sodium: 142 mmol/L (ref 134–144)
Total Protein: 7.6 g/dL (ref 6.0–8.5)

## 2018-02-25 LAB — LIPID PANEL
Chol/HDL Ratio: 1.9 ratio (ref 0.0–4.4)
Cholesterol, Total: 130 mg/dL (ref 100–199)
HDL: 67 mg/dL (ref >39)
LDL Calculated: 47 mg/dL (ref 0–99)
Triglycerides: 82 mg/dL (ref 0–149)
VLDL Cholesterol Cal: 16 mg/dL (ref 5–40)

## 2018-02-25 LAB — HEMOGLOBIN A1C
Est. average glucose Bld gHb Est-mCnc: 214 mg/dL
Hgb A1c MFr Bld: 9.1 % — ABNORMAL HIGH (ref 4.8–5.6)

## 2018-02-25 NOTE — Telephone Encounter (Signed)
Patient called back to discuss labs. Informed her of all lab results. She stated she will increase insulin to 60 units. Would like to work on diet and do the increase in insulin for now. Does not want to see endocrinologist at this time. Told her we will recheck at next follow up. She verbalized understanding of this.

## 2018-03-16 DIAGNOSIS — E119 Type 2 diabetes mellitus without complications: Secondary | ICD-10-CM | POA: Diagnosis not present

## 2018-03-17 DIAGNOSIS — K760 Fatty (change of) liver, not elsewhere classified: Secondary | ICD-10-CM | POA: Diagnosis not present

## 2018-03-17 DIAGNOSIS — K7460 Unspecified cirrhosis of liver: Secondary | ICD-10-CM | POA: Diagnosis not present

## 2018-03-17 DIAGNOSIS — K7469 Other cirrhosis of liver: Secondary | ICD-10-CM | POA: Diagnosis not present

## 2018-03-30 DIAGNOSIS — Z1231 Encounter for screening mammogram for malignant neoplasm of breast: Secondary | ICD-10-CM | POA: Diagnosis not present

## 2018-04-18 ENCOUNTER — Other Ambulatory Visit: Payer: Self-pay | Admitting: Internal Medicine

## 2018-04-18 DIAGNOSIS — Z1231 Encounter for screening mammogram for malignant neoplasm of breast: Secondary | ICD-10-CM

## 2018-04-22 ENCOUNTER — Other Ambulatory Visit: Payer: Self-pay | Admitting: Internal Medicine

## 2018-04-22 ENCOUNTER — Telehealth: Payer: Self-pay

## 2018-04-22 DIAGNOSIS — IMO0001 Reserved for inherently not codable concepts without codable children: Secondary | ICD-10-CM

## 2018-04-22 DIAGNOSIS — E1165 Type 2 diabetes mellitus with hyperglycemia: Principal | ICD-10-CM

## 2018-04-22 MED ORDER — INSULIN GLARGINE 100 UNIT/ML SOLOSTAR PEN: 80.0000 [IU] | PEN_INJECTOR | Freq: Every day | SUBCUTANEOUS | 3 refills | Status: DC

## 2018-04-22 NOTE — Telephone Encounter (Signed)
Went from 50 units to 60 and then 80 when BS was 200 plus. Wants new RX for Lants 80 now

## 2018-05-08 ENCOUNTER — Other Ambulatory Visit: Payer: Self-pay | Admitting: Internal Medicine

## 2018-05-08 DIAGNOSIS — E1165 Type 2 diabetes mellitus with hyperglycemia: Principal | ICD-10-CM

## 2018-05-08 DIAGNOSIS — IMO0001 Reserved for inherently not codable concepts without codable children: Secondary | ICD-10-CM

## 2018-05-09 NOTE — Telephone Encounter (Signed)
error 

## 2018-05-10 ENCOUNTER — Other Ambulatory Visit: Payer: Self-pay

## 2018-05-10 DIAGNOSIS — R21 Rash and other nonspecific skin eruption: Secondary | ICD-10-CM | POA: Diagnosis not present

## 2018-05-10 DIAGNOSIS — Z1231 Encounter for screening mammogram for malignant neoplasm of breast: Secondary | ICD-10-CM

## 2018-06-27 ENCOUNTER — Encounter: Payer: Self-pay | Admitting: Internal Medicine

## 2018-06-27 ENCOUNTER — Other Ambulatory Visit: Payer: Self-pay

## 2018-06-27 ENCOUNTER — Ambulatory Visit (INDEPENDENT_AMBULATORY_CARE_PROVIDER_SITE_OTHER): Payer: BLUE CROSS/BLUE SHIELD | Admitting: Internal Medicine

## 2018-06-27 VITALS — BP 124/64 | HR 83 | Ht 67.0 in | Wt 224.0 lb

## 2018-06-27 DIAGNOSIS — K7460 Unspecified cirrhosis of liver: Secondary | ICD-10-CM

## 2018-06-27 DIAGNOSIS — E1165 Type 2 diabetes mellitus with hyperglycemia: Secondary | ICD-10-CM

## 2018-06-27 DIAGNOSIS — Z23 Encounter for immunization: Secondary | ICD-10-CM

## 2018-06-27 DIAGNOSIS — I10 Essential (primary) hypertension: Secondary | ICD-10-CM

## 2018-06-27 DIAGNOSIS — R188 Other ascites: Secondary | ICD-10-CM

## 2018-06-27 DIAGNOSIS — Z794 Long term (current) use of insulin: Secondary | ICD-10-CM

## 2018-06-27 DIAGNOSIS — E118 Type 2 diabetes mellitus with unspecified complications: Secondary | ICD-10-CM | POA: Diagnosis not present

## 2018-06-27 DIAGNOSIS — E1169 Type 2 diabetes mellitus with other specified complication: Secondary | ICD-10-CM | POA: Diagnosis not present

## 2018-06-27 DIAGNOSIS — IMO0002 Reserved for concepts with insufficient information to code with codable children: Secondary | ICD-10-CM

## 2018-06-27 DIAGNOSIS — E785 Hyperlipidemia, unspecified: Secondary | ICD-10-CM

## 2018-06-27 MED ORDER — LISINOPRIL-HYDROCHLOROTHIAZIDE 20-12.5 MG PO TABS: 1.0000 | ORAL_TABLET | Freq: Two times a day (BID) | ORAL | 3 refills | Status: DC

## 2018-06-27 MED ORDER — AMLODIPINE BESYLATE 10 MG PO TABS: 10.0000 mg | ORAL_TABLET | Freq: Every day | ORAL | 3 refills | Status: DC

## 2018-06-27 MED ORDER — METFORMIN HCL 500 MG PO TABS: 500.0000 mg | ORAL_TABLET | Freq: Two times a day (BID) | ORAL | 3 refills | Status: DC

## 2018-06-27 MED ORDER — ATORVASTATIN CALCIUM 10 MG PO TABS: 10.0000 mg | ORAL_TABLET | Freq: Every day | ORAL | 3 refills | Status: DC

## 2018-06-27 NOTE — Progress Notes (Signed)
Date:  06/27/2018   Name:  Meagan Ibarra   DOB:  May 25, 1960   MRN:  [id number]   Chief Complaint: Diabetes (Foot Exam.) and Immunizations (Patient interested in getting Shingrix. Pt has liver disease and her liver doctor recommended this for her.)  Diabetes  She presents for her follow-up diabetic visit. She has type 2 diabetes mellitus. Her disease course has been improving. Pertinent negatives for hypoglycemia include no headaches or tremors. Pertinent negatives for diabetes include no blurred vision, no chest pain, no fatigue, no foot paresthesias, no polydipsia, no polyuria and no weight loss. There are no hypoglycemic complications. Symptoms are stable. There are no diabetic complications. Current diabetic treatment includes insulin injections and oral agent (dual therapy). An ACE inhibitor/angiotensin II receptor blocker is being taken. Eye exam is current (but no note from recent exam).  Hypertension  This is a chronic problem. The problem is controlled. Pertinent negatives include no blurred vision, chest pain, headaches, palpitations or shortness of breath. Past treatments include ACE inhibitors and diuretics.  Hyperlipidemia  This is a chronic problem. The problem is controlled. Pertinent negatives include no chest pain or shortness of breath. Current antihyperlipidemic treatment includes statins. The current treatment provides significant improvement of lipids.   Lab Results  Component Value Date   HGBA1C 9.1 (H) 02/24/2018    Review of Systems  Constitutional: Negative for appetite change, fatigue, fever, unexpected weight change and weight loss.  HENT: Positive for congestion and postnasal drip. Negative for sinus pressure, tinnitus and trouble swallowing.   Eyes: Negative for blurred vision and visual disturbance.  Respiratory: Negative for cough, chest tightness and shortness of breath.   Cardiovascular: Negative for chest pain, palpitations and leg swelling.    Gastrointestinal: Negative for abdominal pain, constipation and diarrhea.  Endocrine: Negative for polydipsia and polyuria.  Genitourinary: Negative for dysuria and hematuria.  Musculoskeletal: Negative for arthralgias.  Neurological: Negative for tremors, numbness and headaches.  Hematological: Negative for adenopathy.  Psychiatric/Behavioral: Negative for dysphoric mood.    Patient Active Problem List   Diagnosis Date Noted  . Uncontrolled type 2 diabetes mellitus with complication, with long-term current use of insulin ([place]) 02/24/2018  . Chronic right shoulder pain 03/03/2017  . Adenomatous colon polyp 04/17/2016  . FH: CAD (coronary artery disease) 04/17/2016  . Essential (primary) hypertension 11/07/2014  . Hyperlipidemia associated with type 2 diabetes mellitus ([place]) 11/07/2014  . Cirrhosis of liver with ascites ([place]) 03/22/2014  . Fibroid 10/03/2013  . Cyst of right ovary 10/03/2013    Allergies  Allergen Reactions  . Ozempic [Semaglutide] Nausea And Vomiting  . Invokana [Canagliflozin] Itching    Past Surgical History:  Procedure Laterality Date  . BREAST REDUCTION SURGERY  1980  . BREAST SURGERY    . COLONOSCOPY WITH PROPOFOL N/A 12/31/2014   19 polyps found  . COLONOSCOPY WITH PROPOFOL N/A 06/05/2016   Procedure: COLONOSCOPY WITH PROPOFOL;  Surgeon: Lollie Sails, MD;  Location: [facility] ENDOSCOPY;  Service: Endoscopy;  Laterality: N/A;    Social History   Tobacco Use  . Smoking status: Former Research scientist (life sciences)  . Smokeless tobacco: Never Used  Substance Use Topics  . Alcohol use: No    Alcohol/week: 0.0 standard drinks  . Drug use: No     Medication list has been reviewed and updated.  Current Meds  Medication Sig  . amLODipine (NORVASC) 10 MG tablet Take 1 tablet (10 mg total) by mouth daily.  Marland Kitchen atorvastatin (LIPITOR) 10 MG tablet Take 1 tablet (  10 mg total) by mouth daily.  . Insulin Glargine (LANTUS SOLOSTAR) 100 UNIT/ML Solostar Pen Inject 80 Units into  the skin daily.  . Insulin Pen Needle (CARETOUCH PEN NEEDLES) 31G X 5 MM MISC 1 each by Does not apply route daily.  Marland Kitchen JANUVIA 100 MG tablet TAKE 1 TABLET BY MOUTH ONCE DAILY  . lisinopril-hydrochlorothiazide (PRINZIDE,ZESTORETIC) 20-12.5 MG tablet Take 1 tablet by mouth 2 (two) times daily.  . metFORMIN (GLUCOPHAGE) 500 MG tablet Take 1 tablet (500 mg total) by mouth 2 (two) times daily with a meal.    PHQ 2/9 Scores 06/27/2018 02/24/2018 01/08/2017 09/25/2015  PHQ - 2 Score 0 0 0 0   Wt Readings from Last 3 Encounters:  06/27/18 224 lb (101.6 kg)  02/24/18 214 lb (97.1 kg)  10/25/17 214 lb (97.1 kg)    Physical Exam Vitals signs and nursing note reviewed.  Constitutional:      General: She is not in acute distress.    Appearance: She is well-developed.  HENT:     Head: Normocephalic and atraumatic.     Right Ear: Tympanic membrane and ear canal normal.     Left Ear: Tympanic membrane and ear canal normal.     Nose: Nose normal.     Mouth/Throat:     Mouth: Mucous membranes are moist.  Neck:     Musculoskeletal: Normal range of motion and neck supple.     Vascular: No carotid bruit.  Cardiovascular:     Rate and Rhythm: Normal rate and regular rhythm.     Pulses: Normal pulses.  Pulmonary:     Effort: Pulmonary effort is normal. No respiratory distress.     Breath sounds: Normal breath sounds. No wheezing or rhonchi.  Musculoskeletal: Normal range of motion.  Lymphadenopathy:     Cervical: No cervical adenopathy.  Skin:    General: Skin is warm and dry.     Findings: No rash.  Neurological:     Mental Status: She is alert and oriented to person, place, and time.     Sensory: No sensory deficit.  Psychiatric:        Behavior: Behavior normal.        Thought Content: Thought content normal.     BP 124/64   Pulse 83   Ht 5\' 7"  (1.702 m)   Wt 224 lb (101.6 kg)   SpO2 96%   BMI 35.08 kg/m   Assessment and Plan: 1. Uncontrolled type 2 diabetes mellitus with  complication, with long-term current use of insulin (HCC) Glucoses improved by report Pt will request Eye exam notes - Hemoglobin A1c - Comprehensive metabolic panel - metFORMIN (GLUCOPHAGE) 500 MG tablet; Take 1 tablet (500 mg total) by mouth 2 (two) times daily with a meal.  Dispense: 180 tablet; Refill: 3  2. Essential (primary) hypertension controlled - amLODipine (NORVASC) 10 MG tablet; Take 1 tablet (10 mg total) by mouth daily.  Dispense: 90 tablet; Refill: 3 - lisinopril-hydrochlorothiazide (PRINZIDE,ZESTORETIC) 20-12.5 MG tablet; Take 1 tablet by mouth 2 (two) times daily.  Dispense: 180 tablet; Refill: 3  3. Hyperlipidemia associated with type 2 diabetes mellitus ([place]) On statin therapy - atorvastatin (LIPITOR) 10 MG tablet; Take 1 tablet (10 mg total) by mouth daily.  Dispense: 90 tablet; Refill: 3  4. Need for shingles vaccine First dose given today - Varicella-zoster vaccine IM (Shingrix)  5. Cirrhosis of liver with ascites, unspecified hepatic cirrhosis type ([place]) Followed by GI    Partially dictated using Editor, commissioning.  Any errors are unintentional.  Halina Maidens, MD [facility]  06/27/2018

## 2018-06-28 LAB — COMPREHENSIVE METABOLIC PANEL
ALT: 57 IU/L — ABNORMAL HIGH (ref 0–32)
AST: 48 IU/L — ABNORMAL HIGH (ref 0–40)
Albumin/Globulin Ratio: 1.1 — ABNORMAL LOW (ref 1.2–2.2)
Albumin: 4 g/dL (ref 3.8–4.9)
Alkaline Phosphatase: 206 IU/L — ABNORMAL HIGH (ref 39–117)
BUN/Creatinine Ratio: 19 (ref 9–23)
BUN: 14 mg/dL (ref 6–24)
Bilirubin Total: 1 mg/dL (ref 0.0–1.2)
CO2: 27 mmol/L (ref 20–29)
Calcium: 9.2 mg/dL (ref 8.7–10.2)
Chloride: 98 mmol/L (ref 96–106)
Creatinine, Ser: 0.73 mg/dL (ref 0.57–1.00)
GFR calc Af Amer: 107 mL/min/{1.73_m2} (ref >59)
GFR calc non Af Amer: 93 mL/min/{1.73_m2} (ref >59)
Globulin, Total: 3.5 g/dL (ref 1.5–4.5)
Glucose: 225 mg/dL — ABNORMAL HIGH (ref 65–99)
Potassium: 3.7 mmol/L (ref 3.5–5.2)
Sodium: 137 mmol/L (ref 134–144)
Total Protein: 7.5 g/dL (ref 6.0–8.5)

## 2018-06-28 LAB — HEMOGLOBIN A1C
Est. average glucose Bld gHb Est-mCnc: 212 mg/dL
Hgb A1c MFr Bld: 9 % — ABNORMAL HIGH (ref 4.8–5.6)

## 2018-09-23 ENCOUNTER — Other Ambulatory Visit: Payer: Self-pay | Admitting: Internal Medicine

## 2018-09-23 DIAGNOSIS — IMO0001 Reserved for inherently not codable concepts without codable children: Secondary | ICD-10-CM

## 2018-10-10 ENCOUNTER — Encounter: Payer: Self-pay | Admitting: Internal Medicine

## 2018-10-10 ENCOUNTER — Ambulatory Visit (INDEPENDENT_AMBULATORY_CARE_PROVIDER_SITE_OTHER): Payer: BLUE CROSS/BLUE SHIELD | Admitting: Internal Medicine

## 2018-10-10 ENCOUNTER — Other Ambulatory Visit: Payer: Self-pay

## 2018-10-10 VITALS — BP 122/78 | HR 88 | Ht 67.0 in | Wt 223.0 lb

## 2018-10-10 DIAGNOSIS — I10 Essential (primary) hypertension: Secondary | ICD-10-CM

## 2018-10-10 DIAGNOSIS — Z23 Encounter for immunization: Secondary | ICD-10-CM

## 2018-10-10 DIAGNOSIS — E1165 Type 2 diabetes mellitus with hyperglycemia: Secondary | ICD-10-CM

## 2018-10-10 DIAGNOSIS — E118 Type 2 diabetes mellitus with unspecified complications: Secondary | ICD-10-CM

## 2018-10-10 DIAGNOSIS — Z794 Long term (current) use of insulin: Secondary | ICD-10-CM

## 2018-10-10 DIAGNOSIS — IMO0002 Reserved for concepts with insufficient information to code with codable children: Secondary | ICD-10-CM

## 2018-10-10 MED ORDER — SITAGLIPTIN PHOSPHATE 100 MG PO TABS: 100.0000 mg | ORAL_TABLET | Freq: Every day | ORAL | 1 refills | Status: DC

## 2018-10-10 NOTE — Progress Notes (Signed)
Date:  10/10/2018   Name:  Meagan Ibarra   DOB:  Apr 13, 1961   MRN:  [id number]   Chief Complaint: Hypertension and Diabetes (BS- 247 )  Hypertension  This is a chronic problem. The problem is controlled. Pertinent negatives include no chest pain, headaches, palpitations or shortness of breath. Past treatments include ACE inhibitors and diuretics. The current treatment provides significant improvement.  Diabetes  She presents for her follow-up diabetic visit. She has type 2 diabetes mellitus. Her disease course has been worsening. Pertinent negatives for hypoglycemia include no dizziness, headaches or nervousness/anxiousness. Pertinent negatives for diabetes include no chest pain and no fatigue. Symptoms are stable. Current diabetic treatment includes insulin injections and oral agent (dual therapy). She is compliant with treatment all of the time. Her weight is stable. There is no change in her home blood glucose trend. Her breakfast blood glucose range is generally >200 mg/dl.   Lab Results  Component Value Date   HGBA1C 9.0 (H) 06/27/2018    Review of Systems  Constitutional: Negative for chills, fatigue, fever and unexpected weight change.  Respiratory: Negative for cough, chest tightness, shortness of breath and wheezing.   Cardiovascular: Negative for chest pain, palpitations and leg swelling.  Gastrointestinal: Negative for abdominal pain, blood in stool, constipation and diarrhea.  Musculoskeletal: Negative for arthralgias.  Skin: Negative for color change and wound.  Neurological: Negative for dizziness, light-headedness and headaches.  Psychiatric/Behavioral: Negative for dysphoric mood. The patient is not nervous/anxious.     Patient Active Problem List   Diagnosis Date Noted  . Uncontrolled type 2 diabetes mellitus with complication, with long-term current use of insulin ([place]) 02/24/2018  . Chronic right shoulder pain 03/03/2017  . Adenomatous colon polyp 04/17/2016  .  FH: CAD (coronary artery disease) 04/17/2016  . Essential (primary) hypertension 11/07/2014  . Hyperlipidemia associated with type 2 diabetes mellitus ([place]) 11/07/2014  . Cirrhosis of liver with ascites ([place]) 03/22/2014  . Fibroid 10/03/2013  . Cyst of right ovary 10/03/2013    Allergies  Allergen Reactions  . Ozempic [Semaglutide] Nausea And Vomiting  . Invokana [Canagliflozin] Itching    Past Surgical History:  Procedure Laterality Date  . BREAST REDUCTION SURGERY  1980  . BREAST SURGERY    . COLONOSCOPY WITH PROPOFOL N/A 12/31/2014   19 polyps found  . COLONOSCOPY WITH PROPOFOL N/A 06/05/2016   Procedure: COLONOSCOPY WITH PROPOFOL;  Surgeon: Lollie Sails, MD;  Location: [facility] ENDOSCOPY;  Service: Endoscopy;  Laterality: N/A;    Social History   Tobacco Use  . Smoking status: Former Research scientist (life sciences)  . Smokeless tobacco: Never Used  Substance Use Topics  . Alcohol use: No    Alcohol/week: 0.0 standard drinks  . Drug use: No     Medication list has been reviewed and updated.  Current Meds  Medication Sig  . amLODipine (NORVASC) 10 MG tablet Take 1 tablet (10 mg total) by mouth daily.  Marland Kitchen atorvastatin (LIPITOR) 10 MG tablet Take 1 tablet (10 mg total) by mouth daily.  . Insulin Pen Needle (CARETOUCH PEN NEEDLES) 31G X 5 MM MISC 1 each by Does not apply route daily.  Marland Kitchen JANUVIA 100 MG tablet TAKE 1 TABLET BY MOUTH ONCE DAILY  . LANTUS SOLOSTAR 100 UNIT/ML Solostar Pen INJECT 80 UNITS SUBCUTANEOUSLY ONCE DAILY  . lisinopril-hydrochlorothiazide (PRINZIDE,ZESTORETIC) 20-12.5 MG tablet Take 1 tablet by mouth 2 (two) times daily.  . metFORMIN (GLUCOPHAGE) 500 MG tablet Take 1 tablet (500 mg total) by mouth 2 (two)  times daily with a meal.    PHQ 2/9 Scores 10/10/2018 06/27/2018 02/24/2018 01/08/2017  PHQ - 2 Score 0 0 0 0    BP Readings from Last 3 Encounters:  10/10/18 122/78  06/27/18 124/64  02/24/18 104/66    Physical Exam Vitals signs and nursing note reviewed.   Constitutional:      General: She is not in acute distress.    Appearance: She is well-developed.  HENT:     Head: Normocephalic and atraumatic.  Neck:     Musculoskeletal: Normal range of motion and neck supple.  Cardiovascular:     Rate and Rhythm: Normal rate and regular rhythm.     Pulses: Normal pulses.  Pulmonary:     Effort: Pulmonary effort is normal. No respiratory distress.     Breath sounds: No wheezing or rhonchi.  Musculoskeletal: Normal range of motion.     Right lower leg: No edema.     Left lower leg: No edema.  Skin:    General: Skin is warm and dry.     Findings: No rash.  Neurological:     Mental Status: She is alert and oriented to person, place, and time.  Psychiatric:        Behavior: Behavior normal.        Thought Content: Thought content normal.     Wt Readings from Last 3 Encounters:  10/10/18 223 lb (101.2 kg)  06/27/18 224 lb (101.6 kg)  02/24/18 214 lb (97.1 kg)    BP 122/78   Pulse 88   Ht 5\' 7"  (1.702 m)   Wt 223 lb (101.2 kg)   SpO2 98%   BMI 34.93 kg/m   Assessment and Plan: 1. Uncontrolled type 2 diabetes mellitus with complication, with long-term current use of insulin ([place]) Needs more aggressive insulin therapy - will refer - sitaGLIPtin (JANUVIA) 100 MG tablet; Take 1 tablet (100 mg total) by mouth daily.  Dispense: 90 tablet; Refill: 1 - Comprehensive metabolic panel - Hemoglobin A1c - Ambulatory referral to Endocrinology  2. Need for shingles vaccine Second dose today - Varicella-zoster vaccine IM  3. Essential (primary) hypertension controlled   Partially dictated using Editor, commissioning. Any errors are unintentional.  Halina Maidens, MD [facility]  10/10/2018

## 2018-10-11 LAB — COMPREHENSIVE METABOLIC PANEL
ALT: 78 IU/L — ABNORMAL HIGH (ref 0–32)
AST: 85 IU/L — ABNORMAL HIGH (ref 0–40)
Albumin/Globulin Ratio: 1 — ABNORMAL LOW (ref 1.2–2.2)
Albumin: 3.7 g/dL — ABNORMAL LOW (ref 3.8–4.9)
Alkaline Phosphatase: 199 IU/L — ABNORMAL HIGH (ref 39–117)
BUN/Creatinine Ratio: 18 (ref 9–23)
BUN: 16 mg/dL (ref 6–24)
Bilirubin Total: 0.8 mg/dL (ref 0.0–1.2)
CO2: 23 mmol/L (ref 20–29)
Calcium: 8.7 mg/dL (ref 8.7–10.2)
Chloride: 101 mmol/L (ref 96–106)
Creatinine, Ser: 0.9 mg/dL (ref 0.57–1.00)
GFR calc Af Amer: 83 mL/min/{1.73_m2} (ref >59)
GFR calc non Af Amer: 72 mL/min/{1.73_m2} (ref >59)
Globulin, Total: 3.8 g/dL (ref 1.5–4.5)
Glucose: 254 mg/dL — ABNORMAL HIGH (ref 65–99)
Potassium: 3.8 mmol/L (ref 3.5–5.2)
Sodium: 139 mmol/L (ref 134–144)
Total Protein: 7.5 g/dL (ref 6.0–8.5)

## 2018-10-11 LAB — HEMOGLOBIN A1C
Est. average glucose Bld gHb Est-mCnc: 214 mg/dL
Hgb A1c MFr Bld: 9.1 % — ABNORMAL HIGH (ref 4.8–5.6)

## 2018-10-28 DIAGNOSIS — E1165 Type 2 diabetes mellitus with hyperglycemia: Secondary | ICD-10-CM | POA: Diagnosis not present

## 2018-10-28 DIAGNOSIS — I10 Essential (primary) hypertension: Secondary | ICD-10-CM | POA: Diagnosis not present

## 2018-10-28 DIAGNOSIS — E669 Obesity, unspecified: Secondary | ICD-10-CM | POA: Diagnosis not present

## 2018-10-28 DIAGNOSIS — E782 Mixed hyperlipidemia: Secondary | ICD-10-CM | POA: Diagnosis not present

## 2018-12-16 DIAGNOSIS — M5126 Other intervertebral disc displacement, lumbar region: Secondary | ICD-10-CM | POA: Diagnosis not present

## 2018-12-16 DIAGNOSIS — M549 Dorsalgia, unspecified: Secondary | ICD-10-CM | POA: Diagnosis not present

## 2018-12-16 DIAGNOSIS — M47816 Spondylosis without myelopathy or radiculopathy, lumbar region: Secondary | ICD-10-CM | POA: Diagnosis not present

## 2018-12-16 DIAGNOSIS — M79606 Pain in leg, unspecified: Secondary | ICD-10-CM | POA: Diagnosis not present

## 2019-01-26 DIAGNOSIS — E1165 Type 2 diabetes mellitus with hyperglycemia: Secondary | ICD-10-CM | POA: Diagnosis not present

## 2019-01-26 DIAGNOSIS — Z794 Long term (current) use of insulin: Secondary | ICD-10-CM | POA: Diagnosis not present

## 2019-01-26 DIAGNOSIS — E782 Mixed hyperlipidemia: Secondary | ICD-10-CM | POA: Diagnosis not present

## 2019-01-26 LAB — LIPID PANEL
Cholesterol: 122 (ref 0–200)
HDL: 45 (ref 35–70)
LDL Cholesterol: 48
Triglycerides: 142 (ref 40–160)

## 2019-01-26 LAB — HEMOGLOBIN A1C: Hemoglobin A1C: 7.5

## 2019-01-27 DIAGNOSIS — I10 Essential (primary) hypertension: Secondary | ICD-10-CM | POA: Diagnosis not present

## 2019-01-27 DIAGNOSIS — E669 Obesity, unspecified: Secondary | ICD-10-CM | POA: Diagnosis not present

## 2019-01-27 DIAGNOSIS — E782 Mixed hyperlipidemia: Secondary | ICD-10-CM | POA: Diagnosis not present

## 2019-01-27 DIAGNOSIS — E1165 Type 2 diabetes mellitus with hyperglycemia: Secondary | ICD-10-CM | POA: Diagnosis not present

## 2019-02-10 ENCOUNTER — Other Ambulatory Visit: Payer: Self-pay

## 2019-02-10 ENCOUNTER — Encounter: Payer: Self-pay | Admitting: Internal Medicine

## 2019-02-10 ENCOUNTER — Ambulatory Visit (INDEPENDENT_AMBULATORY_CARE_PROVIDER_SITE_OTHER): Payer: BC Managed Care – PPO | Admitting: Internal Medicine

## 2019-02-10 VITALS — BP 112/78 | HR 68 | Resp 16 | Ht 67.0 in | Wt 218.0 lb

## 2019-02-10 DIAGNOSIS — I10 Essential (primary) hypertension: Secondary | ICD-10-CM

## 2019-02-10 DIAGNOSIS — Z23 Encounter for immunization: Secondary | ICD-10-CM

## 2019-02-10 DIAGNOSIS — E1169 Type 2 diabetes mellitus with other specified complication: Secondary | ICD-10-CM

## 2019-02-10 DIAGNOSIS — M9973 Connective tissue and disc stenosis of intervertebral foramina of lumbar region: Secondary | ICD-10-CM | POA: Diagnosis not present

## 2019-02-10 DIAGNOSIS — E118 Type 2 diabetes mellitus with unspecified complications: Secondary | ICD-10-CM | POA: Diagnosis not present

## 2019-02-10 DIAGNOSIS — IMO0002 Reserved for concepts with insufficient information to code with codable children: Secondary | ICD-10-CM

## 2019-02-10 DIAGNOSIS — E1165 Type 2 diabetes mellitus with hyperglycemia: Secondary | ICD-10-CM

## 2019-02-10 DIAGNOSIS — Z794 Long term (current) use of insulin: Secondary | ICD-10-CM

## 2019-02-10 DIAGNOSIS — E785 Hyperlipidemia, unspecified: Secondary | ICD-10-CM

## 2019-02-10 DIAGNOSIS — M48061 Spinal stenosis, lumbar region without neurogenic claudication: Secondary | ICD-10-CM | POA: Insufficient documentation

## 2019-02-10 NOTE — Progress Notes (Signed)
Date:  02/10/2019   Name:  Meagan Ibarra   DOB:  1961-03-26   MRN:  [id number]   Chief Complaint: Diabetes and Hypertension  Hypertension This is a chronic problem. The problem is controlled. Pertinent negatives include no chest pain, headaches, palpitations or shortness of breath. Past treatments include ACE inhibitors, diuretics and calcium channel blockers. The current treatment provides significant improvement.  Diabetes She presents for her follow-up diabetic visit. She has type 2 diabetes mellitus. Her disease course has been improving (now seeing Endocrinology). Pertinent negatives for hypoglycemia include no headaches or tremors. Pertinent negatives for diabetes include no chest pain and no fatigue. Current diabetic treatment includes insulin injections (metformin and Jardiance). She is compliant with treatment most of the time. She is following a generally healthy diet. Her home blood glucose trend is decreasing steadily. An ACE inhibitor/angiotensin II receptor blocker is being taken.  Hyperlipidemia This is a chronic problem. The problem is controlled. Pertinent negatives include no chest pain or shortness of breath. Current antihyperlipidemic treatment includes statins. The current treatment provides significant improvement of lipids.  Back Pain This is a recurrent problem. The problem has been gradually worsening since onset. The pain is present in the lumbar spine. The pain radiates to the left foot. The pain is mild (sciatica and slight left leg weakness). Pertinent negatives include no abdominal pain, chest pain, dysuria, fever, headaches or numbness. Treatments tried: seen by Neurosurgery - for lumbar foraminal stenosis and surgery recommended.   Lab Results  Component Value Date   HGBA1C 7.5 01/26/2019   Lab Results  Component Value Date   CHOL 122 01/26/2019   HDL 45 01/26/2019   LDLCALC 48 01/26/2019   TRIG 142 01/26/2019   CHOLHDL 1.9 02/24/2018      Review of  Systems  Constitutional: Negative for appetite change, fatigue, fever and unexpected weight change.  HENT: Negative for tinnitus and trouble swallowing.   Respiratory: Negative for cough, chest tightness and shortness of breath.   Cardiovascular: Negative for chest pain, palpitations and leg swelling.  Gastrointestinal: Negative for abdominal pain.  Genitourinary: Negative for dysuria and hematuria.  Musculoskeletal: Positive for back pain (and left leg numbness). Negative for arthralgias.  Neurological: Negative for tremors, numbness and headaches.  Psychiatric/Behavioral: Negative for dysphoric mood.    Patient Active Problem List   Diagnosis Date Noted  . Uncontrolled type 2 diabetes mellitus with complication, with long-term current use of insulin ([place]) 02/24/2018  . Chronic right shoulder pain 03/03/2017  . Adenomatous colon polyp 04/17/2016  . FH: CAD (coronary artery disease) 04/17/2016  . Essential (primary) hypertension 11/07/2014  . Hyperlipidemia associated with type 2 diabetes mellitus ([place]) 11/07/2014  . Cirrhosis of liver with ascites ([place]) 03/22/2014  . Fibroid 10/03/2013  . Cyst of right ovary 10/03/2013    Allergies  Allergen Reactions  . Ozempic [Semaglutide] Nausea And Vomiting  . Invokana [Canagliflozin] Itching    Past Surgical History:  Procedure Laterality Date  . BREAST REDUCTION SURGERY  1980  . BREAST SURGERY    . COLONOSCOPY WITH PROPOFOL N/A 12/31/2014   19 polyps found  . COLONOSCOPY WITH PROPOFOL N/A 06/05/2016   Procedure: COLONOSCOPY WITH PROPOFOL;  Surgeon: Lollie Sails, MD;  Location: [facility] ENDOSCOPY;  Service: Endoscopy;  Laterality: N/A;    Social History   Tobacco Use  . Smoking status: Former Research scientist (life sciences)  . Smokeless tobacco: Never Used  Substance Use Topics  . Alcohol use: No    Alcohol/week: 0.0 standard drinks  .  Drug use: No     Medication list has been reviewed and updated.  Current Meds  Medication Sig  . amLODipine  (NORVASC) 10 MG tablet Take 1 tablet (10 mg total) by mouth daily.  Marland Kitchen atorvastatin (LIPITOR) 10 MG tablet Take 1 tablet (10 mg total) by mouth daily.  . empagliflozin (JARDIANCE) 25 MG TABS tablet Take 25 mg by mouth daily.  . Insulin Pen Needle (CARETOUCH PEN NEEDLES) 31G X 5 MM MISC 1 each by Does not apply route daily.  Marland Kitchen LANTUS SOLOSTAR 100 UNIT/ML Solostar Pen INJECT 80 UNITS SUBCUTANEOUSLY ONCE DAILY (Patient taking differently: 90 Units. )  . lisinopril-hydrochlorothiazide (PRINZIDE,ZESTORETIC) 20-12.5 MG tablet Take 1 tablet by mouth 2 (two) times daily.  . metFORMIN (GLUCOPHAGE) 500 MG tablet Take 1 tablet (500 mg total) by mouth 2 (two) times daily with a meal.  . [DISCONTINUED] sitaGLIPtin (JANUVIA) 100 MG tablet Take 1 tablet (100 mg total) by mouth daily.    PHQ 2/9 Scores 02/10/2019 10/10/2018 06/27/2018 02/24/2018  PHQ - 2 Score 0 0 0 0  PHQ- 9 Score 0 - - -    BP Readings from Last 3 Encounters:  02/10/19 112/78  10/10/18 122/78  06/27/18 124/64    Physical Exam Vitals signs and nursing note reviewed.  Constitutional:      General: She is not in acute distress.    Appearance: Normal appearance. She is well-developed.  HENT:     Head: Normocephalic and atraumatic.  Neck:     Musculoskeletal: Normal range of motion.  Cardiovascular:     Rate and Rhythm: Normal rate and regular rhythm.     Pulses: Normal pulses.     Heart sounds: No murmur.  Pulmonary:     Effort: Pulmonary effort is normal. No respiratory distress.  Musculoskeletal:     Right lower leg: No edema.     Left lower leg: No edema.  Lymphadenopathy:     Cervical: No cervical adenopathy.  Skin:    General: Skin is warm and dry.     Capillary Refill: Capillary refill takes less than 2 seconds.     Findings: No rash.  Neurological:     Mental Status: She is alert and oriented to person, place, and time.     Deep Tendon Reflexes:     Reflex Scores:      Patellar reflexes are 2+ on the right side and  2+ on the left side. Psychiatric:        Attention and Perception: Attention normal.        Mood and Affect: Mood normal.        Behavior: Behavior normal.        Thought Content: Thought content normal.     Wt Readings from Last 3 Encounters:  02/10/19 218 lb (98.9 kg)  10/10/18 223 lb (101.2 kg)  06/27/18 224 lb (101.6 kg)    BP 112/78   Pulse 68   Resp 16   Ht 5\' 7"  (1.702 m)   Wt 218 lb (98.9 kg)   SpO2 98%   BMI 34.14 kg/m   Assessment and Plan: 1. Essential (primary) hypertension Clinically stable exam with well controlled BP.   Tolerating medications, lisinopril 20/hctz 12.5 and amlodipine 10 mg, without side effects at this time. Pt to continue current regimen and low sodium diet; benefits of regular exercise as able discussed.   2. Hyperlipidemia associated with type 2 diabetes mellitus ([place]) Tolerating moderate intensity statin medication without side effects at this time  LDL is at goal of < 70 on current dose Continue same therapy without change at this time.   3. Uncontrolled type 2 diabetes mellitus with complication, with long-term current use of insulin (HCC) Much improved A1C now on Jardiance (with diflucan to prevent yeast), metformin and Insulin Continue follow up with Endocrinology  4. Neural foraminal stenosis of lumbar spine Worsening sx with leg pain, cramping and weakness Should consider minimally invasive spinal surgery   5. Need for immunization against influenza - Flu Vaccine QUAD 36+ mos IM   Partially dictated using Editor, commissioning. Any errors are unintentional.  Halina Maidens, MD [facility]  02/10/2019

## 2019-03-20 DIAGNOSIS — K7469 Other cirrhosis of liver: Secondary | ICD-10-CM | POA: Diagnosis not present

## 2019-03-20 DIAGNOSIS — K7460 Unspecified cirrhosis of liver: Secondary | ICD-10-CM | POA: Diagnosis not present

## 2019-04-21 DIAGNOSIS — E1165 Type 2 diabetes mellitus with hyperglycemia: Secondary | ICD-10-CM | POA: Diagnosis not present

## 2019-04-21 DIAGNOSIS — I10 Essential (primary) hypertension: Secondary | ICD-10-CM | POA: Diagnosis not present

## 2019-04-21 DIAGNOSIS — E782 Mixed hyperlipidemia: Secondary | ICD-10-CM | POA: Diagnosis not present

## 2019-04-21 DIAGNOSIS — E669 Obesity, unspecified: Secondary | ICD-10-CM | POA: Diagnosis not present

## 2019-04-25 DIAGNOSIS — E119 Type 2 diabetes mellitus without complications: Secondary | ICD-10-CM | POA: Diagnosis not present

## 2019-04-25 DIAGNOSIS — H25093 Other age-related incipient cataract, bilateral: Secondary | ICD-10-CM | POA: Diagnosis not present

## 2019-04-25 LAB — HM DIABETES EYE EXAM

## 2019-05-01 DIAGNOSIS — K7460 Unspecified cirrhosis of liver: Secondary | ICD-10-CM | POA: Diagnosis not present

## 2019-05-01 DIAGNOSIS — Z794 Long term (current) use of insulin: Secondary | ICD-10-CM | POA: Diagnosis not present

## 2019-05-01 DIAGNOSIS — Z01818 Encounter for other preprocedural examination: Secondary | ICD-10-CM | POA: Diagnosis not present

## 2019-05-01 DIAGNOSIS — Z86010 Personal history of colonic polyps: Secondary | ICD-10-CM | POA: Diagnosis not present

## 2019-05-01 DIAGNOSIS — E1165 Type 2 diabetes mellitus with hyperglycemia: Secondary | ICD-10-CM | POA: Diagnosis not present

## 2019-05-01 LAB — HEMOGLOBIN A1C: Hemoglobin A1C: 9.2

## 2019-05-23 ENCOUNTER — Encounter: Payer: Self-pay | Admitting: Internal Medicine

## 2019-06-02 DIAGNOSIS — Z01818 Encounter for other preprocedural examination: Secondary | ICD-10-CM | POA: Diagnosis not present

## 2019-06-06 DIAGNOSIS — K635 Polyp of colon: Secondary | ICD-10-CM | POA: Diagnosis not present

## 2019-06-06 DIAGNOSIS — D128 Benign neoplasm of rectum: Secondary | ICD-10-CM | POA: Diagnosis not present

## 2019-06-06 DIAGNOSIS — D125 Benign neoplasm of sigmoid colon: Secondary | ICD-10-CM | POA: Diagnosis not present

## 2019-06-06 DIAGNOSIS — Z86010 Personal history of colonic polyps: Secondary | ICD-10-CM | POA: Diagnosis not present

## 2019-06-06 DIAGNOSIS — K766 Portal hypertension: Secondary | ICD-10-CM | POA: Diagnosis not present

## 2019-06-06 DIAGNOSIS — K2100 Gastro-esophageal reflux disease with esophagitis, without bleeding: Secondary | ICD-10-CM | POA: Diagnosis not present

## 2019-06-06 DIAGNOSIS — K7460 Unspecified cirrhosis of liver: Secondary | ICD-10-CM | POA: Diagnosis not present

## 2019-06-06 DIAGNOSIS — K3189 Other diseases of stomach and duodenum: Secondary | ICD-10-CM | POA: Diagnosis not present

## 2019-06-06 DIAGNOSIS — K228 Other specified diseases of esophagus: Secondary | ICD-10-CM | POA: Diagnosis not present

## 2019-06-13 LAB — HM COLONOSCOPY

## 2019-06-19 DIAGNOSIS — K7469 Other cirrhosis of liver: Secondary | ICD-10-CM | POA: Diagnosis not present

## 2019-06-19 DIAGNOSIS — K766 Portal hypertension: Secondary | ICD-10-CM | POA: Diagnosis not present

## 2019-06-19 DIAGNOSIS — Z794 Long term (current) use of insulin: Secondary | ICD-10-CM | POA: Diagnosis not present

## 2019-06-19 DIAGNOSIS — E119 Type 2 diabetes mellitus without complications: Secondary | ICD-10-CM | POA: Diagnosis not present

## 2019-06-25 DIAGNOSIS — K766 Portal hypertension: Secondary | ICD-10-CM | POA: Insufficient documentation

## 2019-06-27 DIAGNOSIS — E782 Mixed hyperlipidemia: Secondary | ICD-10-CM | POA: Diagnosis not present

## 2019-06-27 DIAGNOSIS — E1165 Type 2 diabetes mellitus with hyperglycemia: Secondary | ICD-10-CM | POA: Diagnosis not present

## 2019-06-27 DIAGNOSIS — E669 Obesity, unspecified: Secondary | ICD-10-CM | POA: Diagnosis not present

## 2019-06-27 DIAGNOSIS — I10 Essential (primary) hypertension: Secondary | ICD-10-CM | POA: Diagnosis not present

## 2019-07-11 ENCOUNTER — Encounter: Payer: Self-pay | Admitting: Internal Medicine

## 2019-07-23 ENCOUNTER — Other Ambulatory Visit: Payer: Self-pay | Admitting: Internal Medicine

## 2019-07-23 DIAGNOSIS — E1169 Type 2 diabetes mellitus with other specified complication: Secondary | ICD-10-CM

## 2019-07-23 DIAGNOSIS — IMO0002 Reserved for concepts with insufficient information to code with codable children: Secondary | ICD-10-CM

## 2019-07-23 DIAGNOSIS — E1165 Type 2 diabetes mellitus with hyperglycemia: Secondary | ICD-10-CM

## 2019-07-23 DIAGNOSIS — I10 Essential (primary) hypertension: Secondary | ICD-10-CM

## 2019-08-14 ENCOUNTER — Ambulatory Visit (INDEPENDENT_AMBULATORY_CARE_PROVIDER_SITE_OTHER): Payer: BC Managed Care – PPO | Admitting: Internal Medicine

## 2019-08-14 ENCOUNTER — Other Ambulatory Visit: Payer: Self-pay

## 2019-08-14 ENCOUNTER — Encounter: Payer: Self-pay | Admitting: Internal Medicine

## 2019-08-14 ENCOUNTER — Other Ambulatory Visit (HOSPITAL_COMMUNITY)
Admission: RE | Admit: 2019-08-14 | Discharge: 2019-08-14 | Disposition: A | Discharge status: Home / Self Care | Payer: BC Managed Care – PPO | Source: Ambulatory Visit | Attending: Internal Medicine | Admitting: Internal Medicine

## 2019-08-14 VITALS — BP 102/64 | HR 77 | Temp 98.1°F | Ht 67.0 in | Wt 218.0 lb

## 2019-08-14 DIAGNOSIS — Z124 Encounter for screening for malignant neoplasm of cervix: Secondary | ICD-10-CM | POA: Insufficient documentation

## 2019-08-14 DIAGNOSIS — I10 Essential (primary) hypertension: Secondary | ICD-10-CM

## 2019-08-14 DIAGNOSIS — Z0000 Encounter for general adult medical examination without abnormal findings: Secondary | ICD-10-CM

## 2019-08-14 DIAGNOSIS — Z1231 Encounter for screening mammogram for malignant neoplasm of breast: Secondary | ICD-10-CM

## 2019-08-14 DIAGNOSIS — E1169 Type 2 diabetes mellitus with other specified complication: Secondary | ICD-10-CM

## 2019-08-14 DIAGNOSIS — E118 Type 2 diabetes mellitus with unspecified complications: Secondary | ICD-10-CM | POA: Diagnosis not present

## 2019-08-14 DIAGNOSIS — M48061 Spinal stenosis, lumbar region without neurogenic claudication: Secondary | ICD-10-CM

## 2019-08-14 DIAGNOSIS — K766 Portal hypertension: Secondary | ICD-10-CM

## 2019-08-14 DIAGNOSIS — M9973 Connective tissue and disc stenosis of intervertebral foramina of lumbar region: Secondary | ICD-10-CM

## 2019-08-14 DIAGNOSIS — E785 Hyperlipidemia, unspecified: Secondary | ICD-10-CM | POA: Diagnosis not present

## 2019-08-14 LAB — POCT URINALYSIS DIPSTICK
Bilirubin, UA: NEGATIVE
Blood, UA: NEGATIVE
Glucose, UA: POSITIVE — AB
Ketones, UA: NEGATIVE
Leukocytes, UA: NEGATIVE
Nitrite, UA: NEGATIVE
Protein, UA: NEGATIVE
Spec Grav, UA: 1.01 (ref 1.010–1.025)
Urobilinogen, UA: 0.2 E.U./dL
pH, UA: 5 (ref 5.0–8.0)

## 2019-08-14 NOTE — Progress Notes (Signed)
Date:  08/14/2019   Name:  Meagan Ibarra   DOB:  [birth year]   MRN:  [id number]   Chief Complaint: Annual Exam (Breast exam and papsmear. ) Meagan Ibarra is a 59 y.o. female who presents today for her Complete Annual Exam. She feels fairly well. She reports exercising none. She reports she is sleeping fairly well.   Mammogram  03/2018 Pap  09/2014 Colonoscopy  06/2019 Immunization History  Administered Date(s) Administered  . Hepatitis B 06/09/2015, 07/08/2015, 02/01/2016  . Influenza,inj,Quad PF,6+ Mos 02/23/2018, 02/10/2019  . Influenza-Unspecified 02/16/2018  . Pneumococcal Polysaccharide-23 02/22/2012  . Tdap 02/02/2011  . Zoster Recombinat (Shingrix) 06/27/2018, 10/10/2018    Diabetes She presents for her follow-up diabetic visit. She has type 2 diabetes mellitus. Her disease course has been stable. Pertinent negatives for hypoglycemia include no dizziness, headaches, nervousness/anxiousness or tremors. Pertinent negatives for diabetes include no chest pain, no fatigue, no polydipsia and no polyuria. Current diabetic treatment includes oral agent (dual therapy) and insulin injections (jardiance, metformin, Lantus). An ACE inhibitor/angiotensin II receptor blocker is being taken.  Hypertension This is a chronic problem. The problem is controlled. Pertinent negatives include no chest pain, headaches, palpitations or shortness of breath. Past treatments include diuretics, calcium channel blockers and ACE inhibitors. The current treatment provides significant improvement.  Hyperlipidemia The problem is controlled. Pertinent negatives include no chest pain or shortness of breath. Current antihyperlipidemic treatment includes statins. The current treatment provides significant improvement of lipids. There are no compliance problems.  Risk factors for coronary artery disease include diabetes mellitus and hypertension.  Back Pain This is a chronic problem. The pain is present in the lumbar  spine. The quality of the pain is described as aching and cramping. The pain radiates to the left foot. The pain is moderate. Pertinent negatives include no abdominal pain, chest pain, dysuria, fever or headaches.    Lab Results  Component Value Date   CREATININE 0.90 10/10/2018   BUN 16 10/10/2018   NA 139 10/10/2018   K 3.8 10/10/2018   CL 101 10/10/2018   CO2 23 10/10/2018   Lab Results  Component Value Date   CHOL 122 01/26/2019   HDL 45 01/26/2019   LDLCALC 48 01/26/2019   TRIG 142 01/26/2019   CHOLHDL 1.9 02/24/2018   No results found for: TSH Lab Results  Component Value Date   HGBA1C 9.2 05/01/2019   Lab Results  Component Value Date   WBC 11.9 (H) 06/05/2016   HGB 14.6 06/05/2016   HCT 41.5 06/05/2016   MCV 84.5 06/05/2016   PLT 206 06/05/2016   Lab Results  Component Value Date   ALT 78 (H) 10/10/2018   AST 85 (H) 10/10/2018   ALKPHOS 199 (H) 10/10/2018   BILITOT 0.8 10/10/2018     Review of Systems  Constitutional: Negative for chills, fatigue and fever.  HENT: Negative for congestion, hearing loss, tinnitus, trouble swallowing and voice change.   Eyes: Negative for visual disturbance.  Respiratory: Negative for cough, chest tightness, shortness of breath and wheezing.   Cardiovascular: Negative for chest pain, palpitations and leg swelling.  Gastrointestinal: Negative for abdominal pain, constipation, diarrhea and vomiting.  Endocrine: Negative for polydipsia and polyuria.  Genitourinary: Negative for dysuria, frequency, genital sores, vaginal bleeding and vaginal discharge.  Musculoskeletal: Positive for back pain. Negative for arthralgias, gait problem and joint swelling.  Skin: Negative for color change and rash.  Neurological: Negative for dizziness, tremors, light-headedness and headaches.  Hematological: Negative  for adenopathy. Does not bruise/bleed easily.  Psychiatric/Behavioral: Positive for sleep disturbance. Negative for dysphoric mood.  The patient is not nervous/anxious.     Patient Active Problem List   Diagnosis Date Noted  . Portal hypertension ([place]) 06/25/2019  . 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/10/2019  . Type II diabetes mellitus with complication ([place]) [id number]  . Chronic right shoulder pain 03/03/2017  . Adenomatous colon polyp 04/17/2016  . FH: CAD (coronary artery disease) 04/17/2016  . Essential (primary) hypertension 11/07/2014  . Hyperlipidemia associated with type 2 diabetes mellitus ([place]) 11/07/2014  . Cirrhosis of liver with ascites ([place]) 03/22/2014  . Fibroid 10/03/2013  . Cyst of right ovary 10/03/2013    Allergies  Allergen Reactions  . Ozempic [Semaglutide] Nausea And Vomiting  . Invokana [Canagliflozin] Itching    Past Surgical History:  Procedure Laterality Date  . BREAST REDUCTION SURGERY  1980  . BREAST SURGERY    . COLONOSCOPY WITH PROPOFOL N/A 12/31/2014   19 polyps found  . COLONOSCOPY WITH PROPOFOL N/A 06/05/2016   Procedure: COLONOSCOPY WITH PROPOFOL;  Surgeon: Lollie Sails, MD;  Location: [facility] ENDOSCOPY;  Service: Endoscopy;  Laterality: N/A;    Social History   Tobacco Use  . Smoking status: Former Research scientist (life sciences)  . Smokeless tobacco: Never Used  Substance Use Topics  . Alcohol use: No    Alcohol/week: 0.0 standard drinks  . Drug use: No     Medication list has been reviewed and updated.  Current Meds  Medication Sig  . amLODipine (NORVASC) 10 MG tablet TAKE 1 TABLET BY MOUTH DAILY.  Marland Kitchen atorvastatin (LIPITOR) 10 MG tablet TAKE 1 TABLET BY MOUTH DAILY.  Marland Kitchen empagliflozin (JARDIANCE) 25 MG TABS tablet Take 25 mg by mouth daily.  . Insulin Pen Needle (CARETOUCH PEN NEEDLES) 31G X 5 MM MISC 1 each by Does not apply route daily.  Marland Kitchen LANTUS SOLOSTAR 100 UNIT/ML Solostar Pen INJECT 80 UNITS SUBCUTANEOUSLY ONCE DAILY (Patient taking differently: 90 Units. )  . lisinopril-hydrochlorothiazide (ZESTORETIC) 20-12.5 MG tablet TAKE 1 TABLET BY MOUTH TWICE DAILY  .  metFORMIN (GLUCOPHAGE) 500 MG tablet TAKE 1 TABLET BY MOUTH 2 TIMES DAILY WITH MEALS. GENERIC EQUIVALENT FOR GLUCOPHAGE    PHQ 2/9 Scores 08/14/2019 02/10/2019 10/10/2018 06/27/2018  PHQ - 2 Score 0 0 0 0  PHQ- 9 Score 0 0 - -    BP Readings from Last 3 Encounters:  08/14/19 102/64  02/10/19 112/78  10/10/18 122/78    Physical Exam Vitals and nursing note reviewed.  Constitutional:      General: She is not in acute distress.    Appearance: She is well-developed.  HENT:     Head: Normocephalic and atraumatic.     Right Ear: Tympanic membrane and ear canal normal.     Left Ear: Tympanic membrane and ear canal normal.     Nose:     Right Sinus: No maxillary sinus tenderness.     Left Sinus: No maxillary sinus tenderness.  Eyes:     General: No scleral icterus.       Right eye: No discharge.        Left eye: No discharge.     Conjunctiva/sclera: Conjunctivae normal.  Neck:     Thyroid: No thyromegaly.     Vascular: No carotid bruit.  Cardiovascular:     Rate and Rhythm: Normal rate and regular rhythm.     Pulses: Normal pulses.     Heart sounds: Normal heart sounds.  Pulmonary:  Effort: Pulmonary effort is normal. No respiratory distress.     Breath sounds: No wheezing.  Chest:     Breasts:        Right: No mass, nipple discharge, skin change or tenderness.        Left: No mass, nipple discharge, skin change or tenderness.     Comments: Bilateral breast reduction scars Abdominal:     General: Bowel sounds are normal.     Palpations: Abdomen is soft.     Tenderness: There is no abdominal tenderness.  Genitourinary:    Labia:        Right: No tenderness, lesion or injury.        Left: No tenderness, lesion or injury.      Vagina: Normal.     Cervix: Normal.     Uterus: Normal.      Adnexa: Right adnexa normal and left adnexa normal.     Comments: 3 mm endocervical polyp noted Musculoskeletal:        General: Normal range of motion.     Cervical back: Normal range  of motion. No erythema.     Right lower leg: No edema.     Left lower leg: No edema.  Lymphadenopathy:     Cervical: No cervical adenopathy.  Skin:    General: Skin is warm and dry.     Findings: No rash.  Neurological:     Mental Status: She is alert and oriented to person, place, and time.     Cranial Nerves: No cranial nerve deficit.     Sensory: No sensory deficit.     Deep Tendon Reflexes: Reflexes are normal and symmetric.  Psychiatric:        Speech: Speech normal.        Behavior: Behavior normal.        Thought Content: Thought content normal.     Wt Readings from Last 3 Encounters:  08/14/19 218 lb (98.9 kg)  02/10/19 218 lb (98.9 kg)  10/10/18 223 lb (101.2 kg)    BP 102/64   Pulse 77   Temp 98.1 F (36.7 C) (Oral)   Ht 5\' 7"  (1.702 m)   Wt 218 lb (98.9 kg)   SpO2 98%   BMI 34.14 kg/m   Assessment and Plan: 1. Annual physical exam Normal exam except for weight Need to begin exercise but limited by back and leg pain Considering back surgery in the near future - POCT urinalysis dipstick  2. Encounter for screening mammogram for breast cancer To be scheduled at [facility]; Future  3. Essential (primary) hypertension Clinically stable exam with well controlled BP. Tolerating medications without side effects at this time. Pt to continue current regimen and low sodium diet; benefits of regular exercise as able discussed. - CBC with Differential/Platelet - TSH  4. Type II diabetes mellitus with complication (HCC) Clinically stable by exam and report without s/s of hypoglycemia but last A1C was much higher.  She attributes this to overeating, eating late and the holidays. DM complicated by HTN, lipids. Currently being followed and managed by [facility] Tolerating medications well without side effects or other concerns.  BS are not well controlled per last A1C. Again discussed need for diet changes and especially regular  exercise - Comprehensive metabolic panel - Hemoglobin A1c  5. Hyperlipidemia associated with type 2 diabetes mellitus ([place]) Tolerating statin medication without side effects at this time LDL is at goal of < 70 on  current dose Continue same therapy without change at this time - Lipid panel  6. Encounter for screening for cervical cancer  Pap obtained, exam normal except for endocervical polyp which is asymptomatic - Cytology - PAP  7. Portal hypertension (HCC) Followed by [facility] Recently had colonoscopy and EGD - 3 colon polyps, no varices and mild portal hypertensive gastropathy  8. Neural foraminal stenosis of lumbar spine Seriously considering surgery to control leg symptoms of numbness and cramping   Partially dictated using Editor, commissioning. Any errors are unintentional.  Halina Maidens, MD [facility]  08/14/2019

## 2019-08-15 LAB — CBC WITH DIFFERENTIAL/PLATELET
Basophils Absolute: 0 10*3/uL (ref 0.0–0.2)
Basos: 1 %
EOS (ABSOLUTE): 0.2 10*3/uL (ref 0.0–0.4)
Eos: 2 %
Hematocrit: 42.3 % (ref 34.0–46.6)
Hemoglobin: 13.8 g/dL (ref 11.1–15.9)
Immature Grans (Abs): 0 10*3/uL (ref 0.0–0.1)
Immature Granulocytes: 0 %
Lymphocytes Absolute: 1.5 10*3/uL (ref 0.7–3.1)
Lymphs: 21 %
MCH: 27.9 pg (ref 26.6–33.0)
MCHC: 32.6 g/dL (ref 31.5–35.7)
MCV: 86 fL (ref 79–97)
Monocytes Absolute: 0.7 10*3/uL (ref 0.1–0.9)
Monocytes: 10 %
Neutrophils Absolute: 4.7 10*3/uL (ref 1.4–7.0)
Neutrophils: 66 %
Platelets: 112 10*3/uL — ABNORMAL LOW (ref 150–450)
RBC: 4.95 x10E6/uL (ref 3.77–5.28)
RDW: 14.1 % (ref 11.7–15.4)
WBC: 7.2 10*3/uL (ref 3.4–10.8)

## 2019-08-15 LAB — CYTOLOGY - PAP
Comment: NEGATIVE
Diagnosis: NEGATIVE
High risk HPV: NEGATIVE

## 2019-08-15 LAB — COMPREHENSIVE METABOLIC PANEL
ALT: 69 IU/L — ABNORMAL HIGH (ref 0–32)
AST: 62 IU/L — ABNORMAL HIGH (ref 0–40)
Albumin/Globulin Ratio: 1.3 (ref 1.2–2.2)
Albumin: 4.2 g/dL (ref 3.8–4.9)
Alkaline Phosphatase: 178 IU/L — ABNORMAL HIGH (ref 39–117)
BUN/Creatinine Ratio: 24 — ABNORMAL HIGH (ref 9–23)
BUN: 24 mg/dL (ref 6–24)
Bilirubin Total: 0.9 mg/dL (ref 0.0–1.2)
CO2: 23 mmol/L (ref 20–29)
Calcium: 9.6 mg/dL (ref 8.7–10.2)
Chloride: 101 mmol/L (ref 96–106)
Creatinine, Ser: 1.02 mg/dL — ABNORMAL HIGH (ref 0.57–1.00)
GFR calc Af Amer: 71 mL/min/{1.73_m2} (ref >59)
GFR calc non Af Amer: 62 mL/min/{1.73_m2} (ref >59)
Globulin, Total: 3.3 g/dL (ref 1.5–4.5)
Glucose: 209 mg/dL — ABNORMAL HIGH (ref 65–99)
Potassium: 3.7 mmol/L (ref 3.5–5.2)
Sodium: 142 mmol/L (ref 134–144)
Total Protein: 7.5 g/dL (ref 6.0–8.5)

## 2019-08-15 LAB — LIPID PANEL
Chol/HDL Ratio: 2.2 ratio (ref 0.0–4.4)
Cholesterol, Total: 117 mg/dL (ref 100–199)
HDL: 54 mg/dL (ref >39)
LDL Chol Calc (NIH): 46 mg/dL (ref 0–99)
Triglycerides: 90 mg/dL (ref 0–149)
VLDL Cholesterol Cal: 17 mg/dL (ref 5–40)

## 2019-08-15 LAB — TSH: TSH: 2 u[IU]/mL (ref 0.450–4.500)

## 2019-08-15 LAB — HEMOGLOBIN A1C
Est. average glucose Bld gHb Est-mCnc: 189 mg/dL
Hgb A1c MFr Bld: 8.2 % — ABNORMAL HIGH (ref 4.8–5.6)

## 2019-11-17 DIAGNOSIS — Z794 Long term (current) use of insulin: Secondary | ICD-10-CM | POA: Diagnosis not present

## 2019-11-17 DIAGNOSIS — E1165 Type 2 diabetes mellitus with hyperglycemia: Secondary | ICD-10-CM | POA: Diagnosis not present

## 2019-11-17 DIAGNOSIS — E669 Obesity, unspecified: Secondary | ICD-10-CM | POA: Diagnosis not present

## 2019-11-17 DIAGNOSIS — E782 Mixed hyperlipidemia: Secondary | ICD-10-CM | POA: Diagnosis not present

## 2019-11-17 DIAGNOSIS — I10 Essential (primary) hypertension: Secondary | ICD-10-CM | POA: Diagnosis not present

## 2019-11-23 LAB — HEMOGLOBIN A1C: Hemoglobin A1C: 8.8

## 2020-01-16 ENCOUNTER — Ambulatory Visit
Admission: RE | Admit: 2020-01-16 | Discharge: 2020-01-16 | Disposition: A | Discharge status: Home / Self Care | Payer: BC Managed Care – PPO | Source: Ambulatory Visit | Attending: Internal Medicine | Admitting: Internal Medicine

## 2020-01-16 ENCOUNTER — Ambulatory Visit (INDEPENDENT_AMBULATORY_CARE_PROVIDER_SITE_OTHER)
Admission: EM | Admit: 2020-01-16 | Discharge: 2020-01-16 | Disposition: A | Discharge status: Home / Self Care | Payer: BC Managed Care – PPO | Source: Ambulatory Visit

## 2020-01-16 ENCOUNTER — Other Ambulatory Visit: Payer: Self-pay

## 2020-01-16 DIAGNOSIS — J069 Acute upper respiratory infection, unspecified: Secondary | ICD-10-CM

## 2020-01-16 NOTE — ED Triage Notes (Signed)
Patient reports her son goes to [facility] and began experiencing URI symptoms. Patient states she began experiencing headaches, sinus pressure, ear pain, and cough that is worse at nighttime. Patient concerned about COVID.

## 2020-01-16 NOTE — Discharge Instructions (Signed)
Your COVID test is pending.  You should self quarantine until the test result is back.    Take Tylenol as needed for fever or discomfort.  Rest and keep yourself hydrated.    Go to the emergency department if you develop acute worsening symptoms.     

## 2020-01-16 NOTE — ED Provider Notes (Signed)
Roderic Palau    CSN: [id number] Arrival date & time: 01/16/20  1443      History   Chief Complaint Chief Complaint  Patient presents with  . Sinus Problem  . Otalgia    HPI KEIANNA SIGNER is a 59 y.o. female.   Patient presents with 5-day history of headache, nonproductive cough, sinus pressure and congestion, ear fullness.  She denies fever, chills, sore throat, rash, shortness of breath, vomiting diarrhea or other symptoms.  Treatment attempted at home with Advil.  Patient is fully vaccinated for COVID.  The history is provided by the patient.    Past Medical History:  Diagnosis Date  . Diabetes mellitus without complication ([place])   . Hypertension   . NASH (nonalcoholic steatohepatitis)   . NASH (nonalcoholic steatohepatitis)   . Tubular adenoma of colon     Patient Active Problem List   Diagnosis Date Noted  . Portal hypertension ([place]) 06/25/2019  . 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/10/2019  . Type II diabetes mellitus with complication ([place]) 35/32/9924  . Chronic right shoulder pain 03/03/2017  . Adenomatous colon polyp 04/17/2016  . FH: CAD (coronary artery disease) 04/17/2016  . Essential (primary) hypertension 11/07/2014  . Hyperlipidemia associated with type 2 diabetes mellitus ([place]) 11/07/2014  . Cirrhosis of liver with ascites ([place]) 03/22/2014  . Fibroid 10/03/2013  . Cyst of right ovary 10/03/2013    Past Surgical History:  Procedure Laterality Date  . BREAST REDUCTION SURGERY  1980  . BREAST SURGERY    . COLONOSCOPY WITH PROPOFOL N/A 12/31/2014   19 polyps found  . COLONOSCOPY WITH PROPOFOL N/A 06/05/2016   Procedure: COLONOSCOPY WITH PROPOFOL;  Surgeon: Lollie Sails, MD;  Location[facility] END[facility]COPY;  Service: Endoscopy;  Laterality: N/A;    OB History   No obstetric history on file.      Home Medications    Prior to Admission medications   Medication Sig Start Date End Date Taking? Authorizing Provider  amLODipine  (NORVASC) 10 MG tablet TAKE 1 TABLET BY MOUTH DAILY. 07/24/19   Glean Hess, MD  atorvastatin (LIPITOR) 10 MG tablet TAKE 1 TABLET BY MOUTH DAILY. 07/24/19   Glean Hess, MD  empagliflozin (JARDIANCE) 25 MG TABS tablet Take 25 mg by mouth daily.    [provider]  Insulin Pen Needle (CARETOUCH PEN NEEDLES) 31G X 5 MM MISC 1 each by Does not apply route daily. 10/25/17   Glean Hess, MD  LANTUS SOLOSTAR 100 UNIT/ML Solostar Pen INJECT 80 UNITS SUBCUTANEOUSLY ONCE DAILY Patient taking differently: 90 Units.  09/23/18   Glean Hess, MD  lisinopril-hydrochlorothiazide (ZESTORETIC) 20-12.5 MG tablet TAKE 1 TABLET BY MOUTH TWICE DAILY 07/24/19   Glean Hess, MD  metFORMIN (GLUCOPHAGE) 500 MG tablet TAKE 1 TABLET BY MOUTH 2 TIMES DAILY WITH MEALS. GENERIC EQUIVALENT FOR GLUCOPHAGE 07/24/19   Glean Hess, MD    Family History Family History  Problem Relation Age of Onset  . Hypertension Mother   . CAD Mother 75  . Hypertension Father   . Diabetes Father   . CAD Sister 3  . Atrial fibrillation Sister     Social History Social History   Tobacco Use  . Smoking status: Former Research scientist (life sciences)  . Smokeless tobacco: Never Used  Vaping Use  . Vaping Use: Never used  Substance Use Topics  . Alcohol use: No    Alcohol/week: 0.0 standard drinks  . Drug use: No  Allergies   Ozempic [semaglutide] and Invokana [canagliflozin]   Review of Systems Review of Systems  Constitutional: Negative for chills and fever.  HENT: Positive for congestion, ear pain and sinus pressure. Negative for sore throat.   Eyes: Negative for pain and visual disturbance.  Respiratory: Positive for cough. Negative for shortness of breath.   Cardiovascular: Negative for chest pain and palpitations.  Gastrointestinal: Negative for abdominal pain, diarrhea and vomiting.  Genitourinary: Negative for dysuria and hematuria.  Musculoskeletal: Negative for arthralgias and back pain.  Skin:  Negative for color change and rash.  Neurological: Positive for headaches. Negative for seizures and syncope.  All other systems reviewed and are negative.    Physical Exam Triage Vital Signs ED Triage Vitals [01/16/20 1531]  Enc Vitals Group     BP      Pulse      Resp      Temp      Temp src      SpO2      Weight      Height      Head Circumference      Peak Flow      Pain Score 3     Pain Loc      Pain Edu?      Excl. in [place]?    No data found.  Updated Vital Signs BP 100/65   Pulse 92   Temp 98.1 F (36.7 C)   Resp 16   SpO2 95%   Visual Acuity Right Eye Distance:   Left Eye Distance:   Bilateral Distance:    Right Eye Near:   Left Eye Near:    Bilateral Near:     Physical Exam Vitals and nursing note reviewed.  Constitutional:      General: She is not in acute distress.    Appearance: She is well-developed. She is not ill-appearing.  HENT:     Head: Normocephalic and atraumatic.     Right Ear: Tympanic membrane normal.     Left Ear: Tympanic membrane normal.     Nose: Nose normal.     Mouth/Throat:     Mouth: Mucous membranes are moist.     Pharynx: Oropharynx is clear.  Eyes:     Conjunctiva/sclera: Conjunctivae normal.  Cardiovascular:     Rate and Rhythm: Normal rate and regular rhythm.     Heart sounds: No murmur heard.   Pulmonary:     Effort: Pulmonary effort is normal. No respiratory distress.     Breath sounds: Normal breath sounds.  Abdominal:     Palpations: Abdomen is soft.     Tenderness: There is no abdominal tenderness. There is no guarding or rebound.  Musculoskeletal:     Cervical back: Neck supple.  Skin:    General: Skin is warm and dry.     Findings: No rash.  Neurological:     General: No focal deficit present.     Mental Status: She is alert and oriented to person, place, and time.     Gait: Gait normal.  Psychiatric:        Mood and Affect: Mood normal.        Behavior: Behavior normal.      UC Treatments /  Results  Labs (all labs ordered are listed, but only abnormal results are displayed) Labs Reviewed  NOVEL CORONAVIRUS, NAA    EKG   Radiology No results found.  Procedures Procedures (including critical care time)  Medications Ordered in UC Medications -  No data to display  Initial Impression / Assessment and Plan / UC Course  I have reviewed the triage vital signs and the nursing notes.  Pertinent labs & imaging results that were available during my care of the patient were reviewed by me and considered in my medical decision making (see chart for details).   URI.  PCR COVID pending.  Instructed patient to self quarantine until the test result is back.  Discussed symptomatic treatment including Tylenol, rest, hydration.  Instructed patient to go to the ED if she has acute worsening symptoms.  Patient agrees to plan of care.    Final Clinical Impressions(s) / UC Diagnoses   Final diagnoses:  Upper respiratory tract infection, unspecified type     Discharge Instructions     Your COVID test is pending.  You should self quarantine until the test result is back.    Take Tylenol as needed for fever or discomfort.  Rest and keep yourself hydrated.    Go to the emergency department if you develop acute worsening symptoms.        ED Prescriptions    None     PDMP not reviewed this encounter.   Sharion Balloon, NP 01/16/20 1601

## 2020-01-18 LAB — SARS-COV-2, NAA 2 DAY TAT

## 2020-01-18 LAB — NOVEL CORONAVIRUS, NAA: SARS-CoV-2, NAA: NOT DETECTED

## 2020-02-13 ENCOUNTER — Encounter: Payer: BC Managed Care – PPO | Admitting: Internal Medicine

## 2020-02-13 NOTE — Progress Notes (Deleted)
Date:  02/13/2020   Name:  Meagan Ibarra   DOB:  [birth year]   MRN:  [id number]   Chief Complaint: No chief complaint on file.  Immunization History  Administered Date(s) Administered  . Hepatitis B 06/09/2015, 07/08/2015, 02/01/2016  . Influenza,inj,Quad PF,6+ Mos 02/23/2018, 02/10/2019  . Influenza-Unspecified 02/16/2018  . PFIZER SARS-COV-2 Vaccination 08/04/2019  . Pneumococcal Polysaccharide-23 02/22/2012  . Tdap 02/02/2011  . Zoster Recombinat (Shingrix) 06/27/2018, 10/10/2018    Hypertension This is a chronic problem. The problem is controlled. Risk factors for coronary artery disease include diabetes mellitus and dyslipidemia. Past treatments include ACE inhibitors, diuretics and calcium channel blockers. The current treatment provides significant improvement.  Diabetes She presents for her follow-up diabetic visit. She has type 2 diabetes mellitus. Current diabetic treatment includes insulin injections (metformin and jardiance; Endo added Ozempic last visit in July). An ACE inhibitor/angiotensin II receptor blocker is being taken.    Lab Results  Component Value Date   CREATININE 1.02 (H) 08/14/2019   BUN 24 08/14/2019   NA 142 08/14/2019   K 3.7 08/14/2019   CL 101 08/14/2019   CO2 23 08/14/2019   Lab Results  Component Value Date   CHOL 117 08/14/2019   HDL 54 08/14/2019   LDLCALC 46 08/14/2019   TRIG 90 08/14/2019   CHOLHDL 2.2 08/14/2019   Lab Results  Component Value Date   TSH 2.000 08/14/2019   Lab Results  Component Value Date   HGBA1C 8.8 11/23/2019   Lab Results  Component Value Date   WBC 7.2 08/14/2019   HGB 13.8 08/14/2019   HCT 42.3 08/14/2019   MCV 86 08/14/2019   PLT 112 (L) 08/14/2019   Lab Results  Component Value Date   ALT 69 (H) 08/14/2019   AST 62 (H) 08/14/2019   ALKPHOS 178 (H) 08/14/2019   BILITOT 0.9 08/14/2019     Review of Systems    Patient Active Problem List   Diagnosis Date Noted  . Portal hypertension  ([place]) 06/25/2019  . 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/10/2019  . Type II diabetes mellitus with complication ([place]) 84/69/6295  . Chronic right shoulder pain 03/03/2017  . Adenomatous colon polyp 04/17/2016  . FH: CAD (coronary artery disease) 04/17/2016  . Essential (primary) hypertension 11/07/2014  . Hyperlipidemia associated with type 2 diabetes mellitus ([place]) 11/07/2014  . Cirrhosis of liver with ascites ([place]) 03/22/2014  . Fibroid 10/03/2013  . Cyst of right ovary 10/03/2013    Allergies  Allergen Reactions  . Ozempic [Semaglutide] Nausea And Vomiting  . Invokana [Canagliflozin] Itching    Past Surgical History:  Procedure Laterality Date  . BREAST REDUCTION SURGERY  1980  . BREAST SURGERY    . COLONOSCOPY WITH PROPOFOL N/A 12/31/2014   19 polyps found  . COLONOSCOPY WITH PROPOFOL N/A 06/05/2016   Procedure: COLONOSCOPY WITH PROPOFOL;  Surgeon: Lollie Sails, MD;  Location: [facility] ENDOSCOPY;  Service: Endoscopy;  Laterality: N/A;    Social History   Tobacco Use  . Smoking status: Former Research scientist (life sciences)  . Smokeless tobacco: Never Used  Vaping Use  . Vaping Use: Never used  Substance Use Topics  . Alcohol use: No    Alcohol/week: 0.0 standard drinks  . Drug use: No     Medication list has been reviewed and updated.  No outpatient medications have been marked as taking for the 02/13/20 encounter (Office Visit) with Glean Hess, MD.    [facility] 2/9 Scores 08/14/2019 02/10/2019 10/10/2018 06/27/2018  PHQ - 2 Score 0 0 0 0  PHQ- 9 Score 0 0 - -    No flowsheet data found.  BP Readings from Last 3 Encounters:  01/16/20 100/65  08/14/19 102/64  02/10/19 112/78    Physical Exam  Wt Readings from Last 3 Encounters:  08/14/19 218 lb (98.9 kg)  02/10/19 218 lb (98.9 kg)  10/10/18 223 lb (101.2 kg)    There were no vitals taken for this visit.  Assessment and Plan:

## 2020-03-18 DIAGNOSIS — K7469 Other cirrhosis of liver: Secondary | ICD-10-CM | POA: Diagnosis not present

## 2020-03-18 DIAGNOSIS — K8020 Calculus of gallbladder without cholecystitis without obstruction: Secondary | ICD-10-CM | POA: Diagnosis not present

## 2020-03-18 DIAGNOSIS — K7460 Unspecified cirrhosis of liver: Secondary | ICD-10-CM | POA: Diagnosis not present

## 2020-03-18 DIAGNOSIS — Z23 Encounter for immunization: Secondary | ICD-10-CM | POA: Diagnosis not present

## 2020-03-18 LAB — HEMOGLOBIN A1C: Hemoglobin A1C: 7.8

## 2020-05-05 ENCOUNTER — Encounter: Payer: Self-pay | Admitting: Internal Medicine

## 2020-05-28 DIAGNOSIS — E1165 Type 2 diabetes mellitus with hyperglycemia: Secondary | ICD-10-CM | POA: Diagnosis not present

## 2020-05-28 DIAGNOSIS — I10 Essential (primary) hypertension: Secondary | ICD-10-CM | POA: Diagnosis not present

## 2020-05-28 DIAGNOSIS — E669 Obesity, unspecified: Secondary | ICD-10-CM | POA: Diagnosis not present

## 2020-05-28 DIAGNOSIS — E782 Mixed hyperlipidemia: Secondary | ICD-10-CM | POA: Diagnosis not present

## 2020-06-27 ENCOUNTER — Other Ambulatory Visit: Payer: Self-pay | Admitting: Internal Medicine

## 2020-06-27 DIAGNOSIS — E785 Hyperlipidemia, unspecified: Secondary | ICD-10-CM

## 2020-06-27 DIAGNOSIS — E1169 Type 2 diabetes mellitus with other specified complication: Secondary | ICD-10-CM

## 2020-06-27 DIAGNOSIS — E1165 Type 2 diabetes mellitus with hyperglycemia: Secondary | ICD-10-CM

## 2020-06-27 DIAGNOSIS — I10 Essential (primary) hypertension: Secondary | ICD-10-CM

## 2020-06-27 DIAGNOSIS — IMO0002 Reserved for concepts with insufficient information to code with codable children: Secondary | ICD-10-CM

## 2020-06-27 NOTE — Telephone Encounter (Signed)
Please call pt to schedule an appt for HTN and depression.  KP

## 2020-06-27 NOTE — Telephone Encounter (Signed)
Requested medications are due for refill today yes  Requested medications are on the active medication list yes  Last refill 04/2020 (mail order)  Last visit 08/2019  Future visit scheduled no, was to return in Oct but did not  Notes to clinic Failed protocol due to no valid visit within 6  months, no upcoming appt scheduled.

## 2020-07-16 ENCOUNTER — Encounter: Payer: Self-pay | Admitting: Internal Medicine

## 2020-07-16 ENCOUNTER — Other Ambulatory Visit: Payer: Self-pay

## 2020-07-16 ENCOUNTER — Ambulatory Visit (INDEPENDENT_AMBULATORY_CARE_PROVIDER_SITE_OTHER): Payer: BC Managed Care – PPO | Admitting: Internal Medicine

## 2020-07-16 VITALS — BP 138/74 | HR 90 | Temp 98.2°F | Ht 67.0 in | Wt 216.0 lb

## 2020-07-16 DIAGNOSIS — Z1231 Encounter for screening mammogram for malignant neoplasm of breast: Secondary | ICD-10-CM

## 2020-07-16 DIAGNOSIS — I10 Essential (primary) hypertension: Secondary | ICD-10-CM | POA: Diagnosis not present

## 2020-07-16 DIAGNOSIS — E118 Type 2 diabetes mellitus with unspecified complications: Secondary | ICD-10-CM

## 2020-07-16 DIAGNOSIS — E1169 Type 2 diabetes mellitus with other specified complication: Secondary | ICD-10-CM | POA: Diagnosis not present

## 2020-07-16 DIAGNOSIS — E785 Hyperlipidemia, unspecified: Secondary | ICD-10-CM

## 2020-07-16 MED ORDER — FLUCONAZOLE 150 MG PO TABS: 150.0000 mg | ORAL_TABLET | Freq: Every day | ORAL | 0 refills | Status: DC

## 2020-07-16 NOTE — Progress Notes (Signed)
Date:  07/16/2020   Name:  Meagan Ibarra   DOB:  1961-02-22   MRN:  [id number]   Chief Complaint: Hypertension and Vaginal Itching (Wants diflucan /)  Hypertension This is a chronic problem. The problem is controlled. Pertinent negatives include no chest pain, headaches or shortness of breath. Past treatments include ACE inhibitors, calcium channel blockers and diuretics. The current treatment provides significant improvement. There are no compliance problems.   Diabetes She presents for her follow-up diabetic visit. She has type 2 (followed by Endo at [facility]) diabetes mellitus. Her disease course has been stable. Pertinent negatives for hypoglycemia include no dizziness or headaches. Pertinent negatives for diabetes include no chest pain and no fatigue. Current diabetic treatments: metformin, insulin and jardiance.  Vaginal Itching The patient's primary symptoms include genital itching. The patient's pertinent negatives include no vaginal bleeding or vaginal discharge. This is a recurrent problem. The current episode started in the past 7 days. The problem occurs intermittently. The patient is experiencing no pain. The problem affects both sides. Pertinent negatives include no chills, fever or headaches. Exacerbated by: probably aggravated by [place].    Lab Results  Component Value Date   CREATININE 1.02 (H) 08/14/2019   BUN 24 08/14/2019   NA 142 08/14/2019   K 3.7 08/14/2019   CL 101 08/14/2019   CO2 23 08/14/2019   Lab Results  Component Value Date   CHOL 117 08/14/2019   HDL 54 08/14/2019   LDLCALC 46 08/14/2019   TRIG 90 08/14/2019   CHOLHDL 2.2 08/14/2019   Lab Results  Component Value Date   TSH 2.000 08/14/2019   Lab Results  Component Value Date   HGBA1C 7.8 03/18/2020   Lab Results  Component Value Date   WBC 7.2 08/14/2019   HGB 13.8 08/14/2019   HCT 42.3 08/14/2019   MCV 86 08/14/2019   PLT 112 (L) 08/14/2019   Lab Results  Component Value Date   ALT  69 (H) 08/14/2019   AST 62 (H) 08/14/2019   ALKPHOS 178 (H) 08/14/2019   BILITOT 0.9 08/14/2019     Review of Systems  Constitutional: Negative for chills, fatigue, fever and unexpected weight change.  Eyes: Negative for visual disturbance.  Respiratory: Negative for shortness of breath.   Cardiovascular: Negative for chest pain and leg swelling.  Genitourinary: Negative for vaginal discharge. Genital sores: labial itching.  Neurological: Negative for dizziness and headaches.    Patient Active Problem List   Diagnosis Date Noted  . Portal hypertension ([place]) 06/25/2019  . 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/10/2019  . Type II diabetes mellitus with complication ([place]) 37/02/6268  . Chronic right shoulder pain 03/03/2017  . Adenomatous colon polyp 04/17/2016  . FH: CAD (coronary artery disease) 04/17/2016  . Essential (primary) hypertension 11/07/2014  . Hyperlipidemia associated with type 2 diabetes mellitus ([place]) 11/07/2014  . Cirrhosis of liver with ascites ([place]) 03/22/2014  . Fibroid 10/03/2013  . Cyst of right ovary 10/03/2013    Allergies  Allergen Reactions  . Ozempic [Semaglutide] Nausea And Vomiting  . Invokana [Canagliflozin] Itching    Past Surgical History:  Procedure Laterality Date  . BREAST REDUCTION SURGERY  1980  . BREAST SURGERY    . COLONOSCOPY WITH PROPOFOL N/A 12/31/2014   19 polyps found  . COLONOSCOPY WITH PROPOFOL N/A 06/05/2016   Procedure: COLONOSCOPY WITH PROPOFOL;  Surgeon: Lollie Sails, MD;  Location: [facility] ENDOSCOPY;  Service: Endoscopy;  Laterality: N/A;    Social History  Tobacco Use  . Smoking status: Former Research scientist (life sciences)  . Smokeless tobacco: Never Used  Vaping Use  . Vaping Use: Never used  Substance Use Topics  . Alcohol use: No    Alcohol/week: 0.0 standard drinks  . Drug use: No     Medication list has been reviewed and updated.  Current Meds  Medication Sig  . amLODipine (NORVASC) 10 MG tablet TAKE 1 TABLET BY  MOUTH DAILY .  Marland Kitchen atorvastatin (LIPITOR) 10 MG tablet TAKE 1 TABLET BY MOUTH DAILY .  . empagliflozin (JARDIANCE) 25 MG TABS tablet Take 25 mg by mouth daily.  . fluconazole (DIFLUCAN) 150 MG tablet Take 1 tablet (150 mg total) by mouth daily.  . Insulin Pen Needle (CARETOUCH PEN NEEDLES) 31G X 5 MM MISC 1 each by Does not apply route daily.  Marland Kitchen LANTUS SOLOSTAR 100 UNIT/ML Solostar Pen INJECT 80 UNITS SUBCUTANEOUSLY ONCE DAILY (Patient taking differently: 90 Units.)  . lisinopril-hydrochlorothiazide (ZESTORETIC) 20-12.5 MG tablet TAKE 1 TABLET BY MOUTH TWICE DAILY  . metFORMIN (GLUCOPHAGE) 500 MG tablet TAKE 1 TABLET BY MOUTH 2 TIMES DAILY WITH MEALS . GENERIC EQUIVALENT FOR GLUCOPHAGE  . OZEMPIC, 0.25 OR 0.5 MG/DOSE, 2 MG/1.5ML SOPN SMARTSIG:0.3 Milliliter(s) SUB-Q Once a Week    PHQ 2/9 Scores 07/16/2020 08/14/2019 02/10/2019 10/10/2018  PHQ - 2 Score 0 0 0 0  PHQ- 9 Score 0 0 0 -    GAD 7 : Generalized Anxiety Score 07/16/2020  Nervous, Anxious, on Edge 0  Control/stop worrying 0  Worry too much - different things 0  Trouble relaxing 0  Restless 0  Easily annoyed or irritable 0  Afraid - awful might happen 0  Total GAD 7 Score 0    BP Readings from Last 3 Encounters:  07/16/20 138/74  01/16/20 100/65  08/14/19 102/64    Physical Exam Vitals and nursing note reviewed.  Constitutional:      General: She is not in acute distress.    Appearance: Normal appearance. She is well-developed.  HENT:     Head: Normocephalic and atraumatic.  Cardiovascular:     Rate and Rhythm: Normal rate and regular rhythm.  Pulmonary:     Effort: Pulmonary effort is normal. No respiratory distress.     Breath sounds: No wheezing or rhonchi.  Musculoskeletal:     Cervical back: Normal range of motion and neck supple.     Right lower leg: No edema.     Left lower leg: No edema.  Skin:    General: Skin is warm and dry.     Capillary Refill: Capillary refill takes less than 2 seconds.     Findings: No  rash.  Neurological:     General: No focal deficit present.     Mental Status: She is alert and oriented to person, place, and time.     Sensory: No sensory deficit.  Psychiatric:        Mood and Affect: Mood normal.        Behavior: Behavior normal.     Wt Readings from Last 3 Encounters:  07/16/20 216 lb (98 kg)  08/14/19 218 lb (98.9 kg)  02/10/19 218 lb (98.9 kg)    BP 138/74   Pulse 90   Temp 98.2 F (36.8 C) (Oral)   Ht 5\' 7"  (1.702 m)   Wt 216 lb (98 kg)   SpO2 96%   BMI 33.83 kg/m   Assessment and Plan: 1. Essential (primary) hypertension Clinically stable exam with well controlled BP  on lisinopril, hctz and amlodipine. Tolerating medications without side effects at this time. Pt to continue current regimen and low sodium diet; benefits of regular exercise as able discussed.  2. Hyperlipidemia associated with type 2 diabetes mellitus ([place]) On statin therapy Due for labs next visit  3. Type II diabetes mellitus with complication ([place]) Followed by endocrinology and doing well. Most recent A1C < 8 Itching likely secondary to [place] therapy Will prescribe Diflucan to take once as needed  4. Encounter for screening mammogram for breast cancer Due for mammogram - MM 3D SCREEN BREAST BILATERAL; Future   Partially dictated using Editor, commissioning. Any errors are unintentional.  Halina Maidens, MD [facility]  07/16/2020

## 2020-09-10 DIAGNOSIS — E669 Obesity, unspecified: Secondary | ICD-10-CM | POA: Diagnosis not present

## 2020-09-10 DIAGNOSIS — E782 Mixed hyperlipidemia: Secondary | ICD-10-CM | POA: Diagnosis not present

## 2020-09-10 DIAGNOSIS — Z794 Long term (current) use of insulin: Secondary | ICD-10-CM | POA: Diagnosis not present

## 2020-09-10 DIAGNOSIS — E1165 Type 2 diabetes mellitus with hyperglycemia: Secondary | ICD-10-CM | POA: Diagnosis not present

## 2020-09-10 DIAGNOSIS — I10 Essential (primary) hypertension: Secondary | ICD-10-CM | POA: Diagnosis not present

## 2020-09-12 ENCOUNTER — Other Ambulatory Visit: Payer: Self-pay | Admitting: Internal Medicine

## 2020-09-12 DIAGNOSIS — K8020 Calculus of gallbladder without cholecystitis without obstruction: Secondary | ICD-10-CM | POA: Diagnosis not present

## 2020-09-12 DIAGNOSIS — IMO0002 Reserved for concepts with insufficient information to code with codable children: Secondary | ICD-10-CM

## 2020-09-12 DIAGNOSIS — E1165 Type 2 diabetes mellitus with hyperglycemia: Secondary | ICD-10-CM

## 2020-09-12 DIAGNOSIS — K7581 Nonalcoholic steatohepatitis (NASH): Secondary | ICD-10-CM | POA: Diagnosis not present

## 2020-09-12 DIAGNOSIS — I10 Essential (primary) hypertension: Secondary | ICD-10-CM

## 2020-09-12 DIAGNOSIS — K7469 Other cirrhosis of liver: Secondary | ICD-10-CM | POA: Diagnosis not present

## 2020-09-12 MED ORDER — LISINOPRIL-HYDROCHLOROTHIAZIDE 20-12.5 MG PO TABS: 1.0000 | ORAL_TABLET | Freq: Two times a day (BID) | ORAL | 0 refills | Status: DC

## 2020-09-12 MED ORDER — METFORMIN HCL 500 MG PO TABS: 500.0000 mg | ORAL_TABLET | Freq: Two times a day (BID) | ORAL | 0 refills | Status: DC

## 2020-09-12 MED ORDER — AMLODIPINE BESYLATE 10 MG PO TABS: 1.0000 | ORAL_TABLET | Freq: Every day | ORAL | 0 refills | Status: DC

## 2020-09-27 DIAGNOSIS — B078 Other viral warts: Secondary | ICD-10-CM | POA: Diagnosis not present

## 2020-09-27 DIAGNOSIS — L918 Other hypertrophic disorders of the skin: Secondary | ICD-10-CM | POA: Diagnosis not present

## 2020-09-27 DIAGNOSIS — D485 Neoplasm of uncertain behavior of skin: Secondary | ICD-10-CM | POA: Diagnosis not present

## 2020-09-27 DIAGNOSIS — B079 Viral wart, unspecified: Secondary | ICD-10-CM | POA: Diagnosis not present

## 2020-10-14 ENCOUNTER — Other Ambulatory Visit: Payer: Self-pay | Admitting: Internal Medicine

## 2020-10-14 DIAGNOSIS — E1169 Type 2 diabetes mellitus with other specified complication: Secondary | ICD-10-CM

## 2020-10-14 DIAGNOSIS — E785 Hyperlipidemia, unspecified: Secondary | ICD-10-CM

## 2020-10-21 ENCOUNTER — Ambulatory Visit: Payer: BC Managed Care – PPO | Admitting: Internal Medicine

## 2020-10-30 ENCOUNTER — Other Ambulatory Visit: Payer: Self-pay | Admitting: Internal Medicine

## 2020-10-30 DIAGNOSIS — I10 Essential (primary) hypertension: Secondary | ICD-10-CM

## 2020-10-30 MED ORDER — LISINOPRIL-HYDROCHLOROTHIAZIDE 20-12.5 MG PO TABS: 1.0000 | ORAL_TABLET | Freq: Two times a day (BID) | ORAL | 0 refills | Status: DC

## 2020-12-10 DIAGNOSIS — I10 Essential (primary) hypertension: Secondary | ICD-10-CM | POA: Diagnosis not present

## 2020-12-10 DIAGNOSIS — E669 Obesity, unspecified: Secondary | ICD-10-CM | POA: Diagnosis not present

## 2020-12-10 DIAGNOSIS — E1165 Type 2 diabetes mellitus with hyperglycemia: Secondary | ICD-10-CM | POA: Diagnosis not present

## 2020-12-10 DIAGNOSIS — Z794 Long term (current) use of insulin: Secondary | ICD-10-CM | POA: Diagnosis not present

## 2020-12-10 DIAGNOSIS — E782 Mixed hyperlipidemia: Secondary | ICD-10-CM | POA: Diagnosis not present

## 2020-12-10 LAB — HEMOGLOBIN A1C: Hemoglobin A1C: 7.9

## 2020-12-11 ENCOUNTER — Other Ambulatory Visit: Payer: Self-pay | Admitting: Internal Medicine

## 2020-12-11 DIAGNOSIS — I10 Essential (primary) hypertension: Secondary | ICD-10-CM

## 2020-12-11 DIAGNOSIS — IMO0002 Reserved for concepts with insufficient information to code with codable children: Secondary | ICD-10-CM

## 2020-12-11 DIAGNOSIS — E1165 Type 2 diabetes mellitus with hyperglycemia: Secondary | ICD-10-CM

## 2020-12-12 MED ORDER — AMLODIPINE BESYLATE 10 MG PO TABS: 10.0000 mg | ORAL_TABLET | Freq: Every day | ORAL | 0 refills | Status: DC

## 2020-12-12 MED ORDER — METFORMIN HCL 500 MG PO TABS: 500.0000 mg | ORAL_TABLET | Freq: Two times a day (BID) | ORAL | 0 refills | Status: DC

## 2021-01-01 ENCOUNTER — Encounter: Payer: Self-pay | Admitting: Internal Medicine

## 2021-01-01 ENCOUNTER — Other Ambulatory Visit: Payer: Self-pay

## 2021-01-01 ENCOUNTER — Ambulatory Visit (INDEPENDENT_AMBULATORY_CARE_PROVIDER_SITE_OTHER): Payer: BC Managed Care – PPO | Admitting: Internal Medicine

## 2021-01-01 VITALS — BP 104/64 | HR 93 | Temp 98.1°F | Ht 67.0 in | Wt 216.0 lb

## 2021-01-01 DIAGNOSIS — E118 Type 2 diabetes mellitus with unspecified complications: Secondary | ICD-10-CM | POA: Diagnosis not present

## 2021-01-01 DIAGNOSIS — Z0000 Encounter for general adult medical examination without abnormal findings: Secondary | ICD-10-CM

## 2021-01-01 DIAGNOSIS — Z1231 Encounter for screening mammogram for malignant neoplasm of breast: Secondary | ICD-10-CM | POA: Diagnosis not present

## 2021-01-01 DIAGNOSIS — E785 Hyperlipidemia, unspecified: Secondary | ICD-10-CM | POA: Diagnosis not present

## 2021-01-01 DIAGNOSIS — E1169 Type 2 diabetes mellitus with other specified complication: Secondary | ICD-10-CM

## 2021-01-01 DIAGNOSIS — I10 Essential (primary) hypertension: Secondary | ICD-10-CM | POA: Diagnosis not present

## 2021-01-01 LAB — POCT URINALYSIS DIPSTICK
Bilirubin, UA: NEGATIVE
Blood, UA: NEGATIVE
Glucose, UA: POSITIVE — AB
Ketones, UA: NEGATIVE
Leukocytes, UA: NEGATIVE
Nitrite, UA: NEGATIVE
Protein, UA: NEGATIVE
Spec Grav, UA: <=1.005 — AB (ref 1.010–1.025)
Urobilinogen, UA: 0.2 E.U./dL
pH, UA: 6 (ref 5.0–8.0)

## 2021-01-01 LAB — POCT UA - MICROALBUMIN: Microalbumin Ur, POC: NEGATIVE mg/L

## 2021-01-01 MED ORDER — FLUCONAZOLE 150 MG PO TABS
150.0000 mg | ORAL_TABLET | Freq: Every day | ORAL | 0 refills | Status: DC
Start: 2021-01-01 — End: 2021-07-15

## 2021-01-01 NOTE — Progress Notes (Signed)
Date:  01/01/2021   Name:  Meagan Ibarra   DOB:  [birth year]   MRN:  [id number]   Chief Complaint: Annual Exam (Breast exam no pap- [facility] mobile mammography/ with [employer]) Meagan Ibarra is a 60 y.o. female who presents today for her Complete Annual Exam. She feels well. She reports exercising some. She reports she is sleeping fairly well. Breast complaints none.  Mammogram: 03/2018 [facility] DEXA: none Pap smear: 08/2019 Colonoscopy: 06/2019  Immunization History  Administered Date(s) Administered   Hepatitis B 06/09/2015, 07/08/2015, 02/01/2016   Hepatitis B, adult 06/09/2015, 07/08/2015, 02/01/2016   Influenza,inj,Quad PF,6+ Mos 02/23/2018, 02/10/2019, 03/18/2020   Influenza-Unspecified 02/16/2018, 02/09/2019   PFIZER(Purple Top)SARS-COV-2 Vaccination 08/04/2019, 09/01/2019, 03/18/2020   Pneumococcal Polysaccharide-23 02/22/2012   Tdap 02/02/2011   Zoster Recombinat (Shingrix) 06/27/2018, 10/10/2018    Hypertension This is a chronic problem. The problem is controlled. Pertinent negatives include no chest pain, headaches, palpitations or shortness of breath. Past treatments include ACE inhibitors, calcium channel blockers and diuretics. The current treatment provides significant improvement. There is no history of kidney disease, CAD/MI, CVA, PVD or retinopathy.  Hyperlipidemia Pertinent negatives include no chest pain or shortness of breath.  Diabetes Pertinent negatives for hypoglycemia include no dizziness, headaches, nervousness/anxiousness or tremors. Pertinent negatives for diabetes include no chest pain, no fatigue, no polydipsia and no polyuria. Pertinent negatives for diabetic complications include no CVA, PVD or retinopathy. Her weight is stable. An ACE inhibitor/angiotensin II receptor blocker is being taken.   Lab Results  Component Value Date   CREATININE 1.02 (H) 08/14/2019   BUN 24 08/14/2019   NA 142 08/14/2019   K 3.7 08/14/2019   CL 101 08/14/2019   CO2 23  08/14/2019   Lab Results  Component Value Date   CHOL 117 08/14/2019   HDL 54 08/14/2019   LDLCALC 46 08/14/2019   TRIG 90 08/14/2019   CHOLHDL 2.2 08/14/2019   Lab Results  Component Value Date   TSH 2.000 08/14/2019   Lab Results  Component Value Date   HGBA1C 7.9 12/10/2020   Lab Results  Component Value Date   WBC 7.2 08/14/2019   HGB 13.8 08/14/2019   HCT 42.3 08/14/2019   MCV 86 08/14/2019   PLT 112 (L) 08/14/2019   Lab Results  Component Value Date   ALT 69 (H) 08/14/2019   AST 62 (H) 08/14/2019   ALKPHOS 178 (H) 08/14/2019   BILITOT 0.9 08/14/2019     Review of Systems  Constitutional:  Negative for chills, fatigue and fever.  HENT:  Negative for congestion, hearing loss, tinnitus, trouble swallowing and voice change.   Eyes:  Negative for visual disturbance.  Respiratory:  Negative for cough, chest tightness, shortness of breath and wheezing.   Cardiovascular:  Negative for chest pain, palpitations and leg swelling.  Gastrointestinal:  Negative for abdominal pain, constipation, diarrhea and vomiting.  Endocrine: Negative for polydipsia and polyuria.  Genitourinary:  Negative for dysuria, frequency, genital sores, vaginal bleeding and vaginal discharge.  Musculoskeletal:  Negative for arthralgias, gait problem and joint swelling.  Skin:  Negative for color change and rash.  Neurological:  Negative for dizziness, tremors, light-headedness and headaches.  Hematological:  Negative for adenopathy. Does not bruise/bleed easily.  Psychiatric/Behavioral:  Negative for dysphoric mood and sleep disturbance. The patient is not nervous/anxious.    Patient Active Problem List   Diagnosis Date Noted   Portal hypertension ([place]) 06/25/2019   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/10/2019  Type II diabetes mellitus with complication ([place]) [id number]   Chronic right shoulder pain 03/03/2017   Adenomatous colon polyp 04/17/2016   FH: CAD (coronary artery disease)  04/17/2016   Essential (primary) hypertension 11/07/2014   Hyperlipidemia associated with type 2 diabetes mellitus ([place]) 11/07/2014   Cirrhosis of liver with ascites ([place]) 03/22/2014   Fibroid 10/03/2013   Cyst of right ovary 10/03/2013    Allergies  Allergen Reactions   Ozempic [Semaglutide] Nausea And Vomiting   Invokana [Canagliflozin] Itching    Past Surgical History:  Procedure Laterality Date   BREAST REDUCTION SURGERY  1980   BREAST SURGERY     COLONOSCOPY WITH PROPOFOL N/A 12/31/2014   19 polyps found   COLONOSCOPY WITH PROPOFOL N/A 06/05/2016   Procedure: COLONOSCOPY WITH PROPOFOL;  Surgeon: Lollie Sails, MD;  Location: [facility] ENDOSCOPY;  Service: Endoscopy;  Laterality: N/A;    Social History   Tobacco Use   Smoking status: Former   Smokeless tobacco: Never  Scientific laboratory technician Use: Never used  Substance Use Topics   Alcohol use: No    Alcohol/week: 0.0 standard drinks   Drug use: No     Medication list has been reviewed and updated.  Current Meds  Medication Sig   amLODipine (NORVASC) 10 MG tablet Take 1 tablet (10 mg total) by mouth daily.   atorvastatin (LIPITOR) 10 MG tablet TAKE 1 TABLET BY MOUTH DAILY .   Bioflavonoid Products (BIOFLEX PO) Take by mouth daily. For joint mobility   empagliflozin (JARDIANCE) 25 MG TABS tablet Take 25 mg by mouth daily.   Insulin Pen Needle (CARETOUCH PEN NEEDLES) 31G X 5 MM MISC 1 each by Does not apply route daily.   lisinopril-hydrochlorothiazide (ZESTORETIC) 20-12.5 MG tablet Take 1 tablet by mouth 2 (two) times daily.   metFORMIN (GLUCOPHAGE) 500 MG tablet Take 1 tablet (500 mg total) by mouth 2 (two) times daily with a meal.   SEMGLEE, YFGN, 100 UNIT/ML Pen Inject into the skin.   [DISCONTINUED] fluconazole (DIFLUCAN) 150 MG tablet Take 1 tablet (150 mg total) by mouth daily. (Patient taking differently: Take 150 mg by mouth every 30 (thirty) days.)    PHQ 2/9 Scores 01/01/2021 07/16/2020 08/14/2019 02/10/2019  PHQ  - 2 Score 0 0 0 0  PHQ- 9 Score 0 0 0 0    GAD 7 : Generalized Anxiety Score 01/01/2021 07/16/2020  Nervous, Anxious, on Edge 0 0  Control/stop worrying 0 0  Worry too much - different things 0 0  Trouble relaxing 0 0  Restless 0 0  Easily annoyed or irritable 0 0  Afraid - awful might happen 0 0  Total GAD 7 Score 0 0    BP Readings from Last 3 Encounters:  01/01/21 104/64  07/16/20 138/74  01/16/20 100/65    Physical Exam Vitals and nursing note reviewed.  Constitutional:      General: She is not in acute distress.    Appearance: She is well-developed.  HENT:     Head: Normocephalic and atraumatic.     Right Ear: Tympanic membrane and ear canal normal.     Left Ear: Tympanic membrane and ear canal normal.     Nose:     Right Sinus: No maxillary sinus tenderness.     Left Sinus: No maxillary sinus tenderness.  Eyes:     General: No scleral icterus.       Right eye: No discharge.        Left eye:  No discharge.     Conjunctiva/sclera: Conjunctivae normal.  Neck:     Thyroid: No thyromegaly.     Vascular: No carotid bruit.  Cardiovascular:     Rate and Rhythm: Normal rate and regular rhythm.     Pulses: Normal pulses.     Heart sounds: Normal heart sounds.  Pulmonary:     Effort: Pulmonary effort is normal. No respiratory distress.     Breath sounds: No wheezing.  Chest:  Breasts:    Right: Skin change present. No mass, nipple discharge or tenderness.     Left: Skin change present. No mass, nipple discharge or tenderness.     Comments: Breast reduction scars bilaterally - no dominate mass  Scattered lesions c/w scar tissue Abdominal:     General: Bowel sounds are normal.     Palpations: Abdomen is soft.     Tenderness: There is no abdominal tenderness.  Musculoskeletal:     Cervical back: Normal range of motion. No erythema.     Right lower leg: No edema.     Left lower leg: No edema.  Lymphadenopathy:     Cervical: No cervical adenopathy.  Skin:     General: Skin is warm and dry.     Findings: No rash.  Neurological:     Mental Status: She is alert and oriented to person, place, and time.     Cranial Nerves: No cranial nerve deficit.     Sensory: No sensory deficit.     Deep Tendon Reflexes: Reflexes are normal and symmetric.  Psychiatric:        Attention and Perception: Attention normal.        Mood and Affect: Mood normal.    Wt Readings from Last 3 Encounters:  01/01/21 216 lb (98 kg)  07/16/20 216 lb (98 kg)  08/14/19 218 lb (98.9 kg)    BP 104/64   Pulse 93   Temp 98.1 F (36.7 C) (Oral)   Ht '5\' 7"'$  (1.702 m)   Wt 216 lb (98 kg)   SpO2 97%   BMI 33.83 kg/m   Assessment and Plan: 1. Annual physical exam Exam is normal except for weight. Encourage regular exercise and appropriate dietary changes. Up to date on immunizations.  2. Encounter for screening mammogram for breast cancer Pt to schedule at [facility] unit  3. Essential (primary) hypertension Clinically stable exam with well controlled BP. Tolerating medications without side effects at this time. Pt to continue current regimen and low sodium diet; benefits of regular exercise as able discussed.  4. Hyperlipidemia associated with type 2 diabetes mellitus ([place]) Tolerating statin medication without side effects at this time LDL is at goal of < 70 on current dose Continue same therapy without change at this time.  5. Type II diabetes mellitus with complication ([place]) Clinically stable by exam and report without s/s of hypoglycemia. DM complicated by hypertension and dyslipidemia. Begin followed by Endo. Taking Diflucan once a month to control yeast secondary to [employer] medications well without side effects or other concerns. Pt will schedule DM eye exam  Partially dictated using Dragon software. Any errors are unintentional.  Halina Maidens, MD [facility]  01/01/2021

## 2021-01-01 NOTE — Patient Instructions (Signed)
Schedule Eye exam  Schedule Mammogram  Ask for the reports to be sent to me.

## 2021-01-02 LAB — LIPID PANEL
Chol/HDL Ratio: 2.1 ratio (ref 0.0–4.4)
Cholesterol, Total: 112 mg/dL (ref 100–199)
HDL: 53 mg/dL (ref >39)
LDL Chol Calc (NIH): 42 mg/dL (ref 0–99)
Triglycerides: 89 mg/dL (ref 0–149)
VLDL Cholesterol Cal: 17 mg/dL (ref 5–40)

## 2021-01-02 LAB — CBC WITH DIFFERENTIAL/PLATELET
Basophils Absolute: 0.1 10*3/uL (ref 0.0–0.2)
Basos: 1 %
EOS (ABSOLUTE): 0.1 10*3/uL (ref 0.0–0.4)
Eos: 2 %
Hematocrit: 39.6 % (ref 34.0–46.6)
Hemoglobin: 12.9 g/dL (ref 11.1–15.9)
Immature Grans (Abs): 0 10*3/uL (ref 0.0–0.1)
Immature Granulocytes: 1 %
Lymphocytes Absolute: 1.3 10*3/uL (ref 0.7–3.1)
Lymphs: 17 %
MCH: 25.4 pg — ABNORMAL LOW (ref 26.6–33.0)
MCHC: 32.6 g/dL (ref 31.5–35.7)
MCV: 78 fL — ABNORMAL LOW (ref 79–97)
Monocytes Absolute: 0.7 10*3/uL (ref 0.1–0.9)
Monocytes: 9 %
Neutrophils Absolute: 5.3 10*3/uL (ref 1.4–7.0)
Neutrophils: 70 %
Platelets: 122 10*3/uL — ABNORMAL LOW (ref 150–450)
RBC: 5.07 x10E6/uL (ref 3.77–5.28)
RDW: 15.2 % (ref 11.7–15.4)
WBC: 7.6 10*3/uL (ref 3.4–10.8)

## 2021-01-02 LAB — COMPREHENSIVE METABOLIC PANEL
ALT: 41 IU/L — ABNORMAL HIGH (ref 0–32)
AST: 41 IU/L — ABNORMAL HIGH (ref 0–40)
Albumin/Globulin Ratio: 1.5 (ref 1.2–2.2)
Albumin: 4.5 g/dL (ref 3.8–4.9)
Alkaline Phosphatase: 122 IU/L — ABNORMAL HIGH (ref 44–121)
BUN/Creatinine Ratio: 17 (ref 9–23)
BUN: 15 mg/dL (ref 6–24)
Bilirubin Total: 0.9 mg/dL (ref 0.0–1.2)
CO2: 24 mmol/L (ref 20–29)
Calcium: 9.5 mg/dL (ref 8.7–10.2)
Chloride: 101 mmol/L (ref 96–106)
Creatinine, Ser: 0.88 mg/dL (ref 0.57–1.00)
Globulin, Total: 3 g/dL (ref 1.5–4.5)
Glucose: 186 mg/dL — ABNORMAL HIGH (ref 65–99)
Potassium: 3.9 mmol/L (ref 3.5–5.2)
Sodium: 142 mmol/L (ref 134–144)
Total Protein: 7.5 g/dL (ref 6.0–8.5)
eGFR: 76 mL/min/{1.73_m2} (ref >59)

## 2021-01-02 LAB — TSH: TSH: 1.76 u[IU]/mL (ref 0.450–4.500)

## 2021-01-13 ENCOUNTER — Other Ambulatory Visit: Payer: Self-pay | Admitting: Internal Medicine

## 2021-01-13 DIAGNOSIS — E1169 Type 2 diabetes mellitus with other specified complication: Secondary | ICD-10-CM

## 2021-01-14 NOTE — Telephone Encounter (Signed)
Requested Prescriptions  Pending Prescriptions Disp Refills  . atorvastatin (LIPITOR) 10 MG tablet [Pharmacy Med Name: ATORVASTATIN TABS '10MG'$ ] 90 tablet 3    Sig: TAKE 1 TABLET BY MOUTH DAILY .     Cardiovascular:  Antilipid - Statins Passed - 01/13/2021  1:18 AM      Passed - Total Cholesterol in normal range and within 360 days    Cholesterol, Total  Date Value Ref Range Status  01/01/2021 112 100 - 199 mg/dL Final         Passed - LDL in normal range and within 360 days    LDL Chol Calc (NIH)  Date Value Ref Range Status  01/01/2021 42 0 - 99 mg/dL Final         Passed - HDL in normal range and within 360 days    HDL  Date Value Ref Range Status  01/01/2021 53 >39 mg/dL Final         Passed - Triglycerides in normal range and within 360 days    Triglycerides  Date Value Ref Range Status  01/01/2021 89 0 - 149 mg/dL Final         Passed - Patient is not pregnant      Passed - Valid encounter within last 12 months    Recent Outpatient Visits          1 week ago Annual physical exam   [facility] Glean Hess, MD   6 months ago Essential (primary) hypertension   [facility] Glean Hess, MD   1 year ago Annual physical exam   [facility] Glean Hess, MD   1 year ago Essential (primary) hypertension   [facility] Glean Hess, MD   2 years ago Uncontrolled type 2 diabetes mellitus with complication, with long-term current use of insulin [facility])   [facility] Glean Hess, MD      Future Appointments            In 5 months [facility] Melia Jesse Sans, MD [facility]   In 11 months Army Melia Jesse Sans, MD [facility]

## 2021-01-29 ENCOUNTER — Other Ambulatory Visit: Payer: Self-pay | Admitting: Internal Medicine

## 2021-01-29 DIAGNOSIS — I10 Essential (primary) hypertension: Secondary | ICD-10-CM

## 2021-01-30 MED ORDER — LISINOPRIL-HYDROCHLOROTHIAZIDE 20-12.5 MG PO TABS: 1.0000 | ORAL_TABLET | Freq: Two times a day (BID) | ORAL | 0 refills | Status: DC

## 2021-03-07 DIAGNOSIS — Z23 Encounter for immunization: Secondary | ICD-10-CM | POA: Diagnosis not present

## 2021-03-14 DIAGNOSIS — Z1231 Encounter for screening mammogram for malignant neoplasm of breast: Secondary | ICD-10-CM | POA: Diagnosis not present

## 2021-03-17 ENCOUNTER — Other Ambulatory Visit: Payer: Self-pay | Admitting: Internal Medicine

## 2021-03-17 ENCOUNTER — Encounter: Payer: Self-pay | Admitting: Internal Medicine

## 2021-03-17 DIAGNOSIS — I10 Essential (primary) hypertension: Secondary | ICD-10-CM

## 2021-03-17 DIAGNOSIS — E118 Type 2 diabetes mellitus with unspecified complications: Secondary | ICD-10-CM

## 2021-03-17 MED ORDER — AMLODIPINE BESYLATE 10 MG PO TABS: 10.0000 mg | ORAL_TABLET | Freq: Every day | ORAL | 0 refills | Status: DC

## 2021-04-21 DIAGNOSIS — K7469 Other cirrhosis of liver: Secondary | ICD-10-CM | POA: Diagnosis not present

## 2021-04-21 DIAGNOSIS — K7689 Other specified diseases of liver: Secondary | ICD-10-CM | POA: Diagnosis not present

## 2021-04-21 DIAGNOSIS — K766 Portal hypertension: Secondary | ICD-10-CM | POA: Diagnosis not present

## 2021-04-21 DIAGNOSIS — Z87891 Personal history of nicotine dependence: Secondary | ICD-10-CM | POA: Diagnosis not present

## 2021-04-21 DIAGNOSIS — Z794 Long term (current) use of insulin: Secondary | ICD-10-CM | POA: Diagnosis not present

## 2021-04-21 DIAGNOSIS — Z86010 Personal history of colonic polyps: Secondary | ICD-10-CM | POA: Diagnosis not present

## 2021-04-21 DIAGNOSIS — K7581 Nonalcoholic steatohepatitis (NASH): Secondary | ICD-10-CM | POA: Diagnosis not present

## 2021-04-21 DIAGNOSIS — R161 Splenomegaly, not elsewhere classified: Secondary | ICD-10-CM | POA: Diagnosis not present

## 2021-04-21 DIAGNOSIS — K7460 Unspecified cirrhosis of liver: Secondary | ICD-10-CM | POA: Diagnosis not present

## 2021-04-21 DIAGNOSIS — Z7984 Long term (current) use of oral hypoglycemic drugs: Secondary | ICD-10-CM | POA: Diagnosis not present

## 2021-04-21 DIAGNOSIS — E119 Type 2 diabetes mellitus without complications: Secondary | ICD-10-CM | POA: Diagnosis not present

## 2021-04-24 ENCOUNTER — Other Ambulatory Visit: Payer: Self-pay | Admitting: Internal Medicine

## 2021-04-24 DIAGNOSIS — I10 Essential (primary) hypertension: Secondary | ICD-10-CM

## 2021-04-24 MED ORDER — LISINOPRIL-HYDROCHLOROTHIAZIDE 20-12.5 MG PO TABS: 1.0000 | ORAL_TABLET | Freq: Two times a day (BID) | ORAL | 0 refills | Status: DC

## 2021-06-03 DIAGNOSIS — I10 Essential (primary) hypertension: Secondary | ICD-10-CM | POA: Diagnosis not present

## 2021-06-03 DIAGNOSIS — E1165 Type 2 diabetes mellitus with hyperglycemia: Secondary | ICD-10-CM | POA: Diagnosis not present

## 2021-06-03 DIAGNOSIS — Z794 Long term (current) use of insulin: Secondary | ICD-10-CM | POA: Diagnosis not present

## 2021-06-03 LAB — HEMOGLOBIN A1C: Hemoglobin A1C: 8.1

## 2021-06-03 LAB — MICROALBUMIN / CREATININE URINE RATIO: Microalb Creat Ratio: NORMAL

## 2021-06-07 ENCOUNTER — Other Ambulatory Visit: Payer: Self-pay | Admitting: Internal Medicine

## 2021-06-07 DIAGNOSIS — I10 Essential (primary) hypertension: Secondary | ICD-10-CM

## 2021-06-08 MED ORDER — AMLODIPINE BESYLATE 10 MG PO TABS: 10.0000 mg | ORAL_TABLET | Freq: Every day | ORAL | 0 refills | Status: DC

## 2021-06-08 MED ORDER — LISINOPRIL-HYDROCHLOROTHIAZIDE 20-12.5 MG PO TABS: 1.0000 | ORAL_TABLET | Freq: Two times a day (BID) | ORAL | 0 refills | Status: DC

## 2021-06-15 ENCOUNTER — Other Ambulatory Visit: Payer: Self-pay | Admitting: Internal Medicine

## 2021-06-15 DIAGNOSIS — E118 Type 2 diabetes mellitus with unspecified complications: Secondary | ICD-10-CM

## 2021-06-15 MED ORDER — METFORMIN HCL 500 MG PO TABS: 500.0000 mg | ORAL_TABLET | Freq: Two times a day (BID) | ORAL | 0 refills | Status: DC

## 2021-07-04 ENCOUNTER — Encounter: Payer: Self-pay | Admitting: Internal Medicine

## 2021-07-04 ENCOUNTER — Ambulatory Visit: Payer: BC Managed Care – PPO | Admitting: Internal Medicine

## 2021-07-14 ENCOUNTER — Encounter: Payer: Self-pay | Admitting: Internal Medicine

## 2021-07-15 ENCOUNTER — Other Ambulatory Visit: Payer: Self-pay

## 2021-07-15 ENCOUNTER — Ambulatory Visit (INDEPENDENT_AMBULATORY_CARE_PROVIDER_SITE_OTHER): Payer: BC Managed Care – PPO | Admitting: Internal Medicine

## 2021-07-15 ENCOUNTER — Encounter: Payer: Self-pay | Admitting: Internal Medicine

## 2021-07-15 VITALS — BP 122/60 | HR 78 | Ht 67.0 in | Wt 221.0 lb

## 2021-07-15 DIAGNOSIS — I10 Essential (primary) hypertension: Secondary | ICD-10-CM | POA: Diagnosis not present

## 2021-07-15 DIAGNOSIS — E118 Type 2 diabetes mellitus with unspecified complications: Secondary | ICD-10-CM

## 2021-07-15 DIAGNOSIS — B3731 Acute candidiasis of vulva and vagina: Secondary | ICD-10-CM | POA: Diagnosis not present

## 2021-07-15 MED ORDER — FLUCONAZOLE 150 MG PO TABS: 150.0000 mg | ORAL_TABLET | Freq: Every day | ORAL | 1 refills | Status: DC

## 2021-07-15 NOTE — Progress Notes (Signed)
? ? ?Date:  07/15/2021  ? ?Name:  Meagan Ibarra   DOB:  12/18/1960   MRN:  [id number] ? ? ?Chief Complaint: Diabetes and Hypertension ? ?Diabetes ?She presents for her follow-up diabetic visit. She has type 2 diabetes mellitus. Her disease course has been stable. Pertinent negatives for hypoglycemia include no headaches or tremors. There are no diabetic associated symptoms. Pertinent negatives for diabetes include no chest pain, no fatigue, no polydipsia and no polyuria.  ?Hypertension ?This is a chronic problem. The current episode started more than 1 year ago. The problem has been waxing and waning since onset. The problem is controlled. Pertinent negatives include no chest pain, headaches, palpitations or shortness of breath.  ?Vaginal Itching ?The patient's primary symptoms include vaginal discharge (intermittent yeast sx due to Jardiance tx). This is a recurrent problem. The problem occurs intermittently. The patient is experiencing no pain. She is not pregnant. Pertinent negatives include no abdominal pain, dysuria, fever, headaches or hematuria. She has tried antifungals for the symptoms. The treatment provided significant relief.  ? ?Lab Results  ?Component Value Date  ? NA 142 01/01/2021  ? K 3.9 01/01/2021  ? CO2 24 01/01/2021  ? GLUCOSE 186 (H) 01/01/2021  ? BUN 15 01/01/2021  ? CREATININE 0.88 01/01/2021  ? CALCIUM 9.5 01/01/2021  ? EGFR 76 01/01/2021  ? GFRNONAA 62 08/14/2019  ? ?Lab Results  ?Component Value Date  ? CHOL 112 01/01/2021  ? HDL 53 01/01/2021  ? [place] 42 01/01/2021  ? TRIG 89 01/01/2021  ? CHOLHDL 2.1 01/01/2021  ? ?Lab Results  ?Component Value Date  ? TSH 1.760 01/01/2021  ? ?Lab Results  ?Component Value Date  ? HGBA1C 8.1 06/03/2021  ? ?Lab Results  ?Component Value Date  ? WBC 7.6 01/01/2021  ? HGB 12.9 01/01/2021  ? HCT 39.6 01/01/2021  ? MCV 78 (L) 01/01/2021  ? PLT 122 (L) 01/01/2021  ? ?Lab Results  ?Component Value Date  ? ALT 41 (H) 01/01/2021  ? AST 41 (H) 01/01/2021  ?  ALKPHOS 122 (H) 01/01/2021  ? BILITOT 0.9 01/01/2021  ? ?No results found for: 25OHVITD2, [place], VD25OH  ? ?Review of Systems  ?Constitutional:  Negative for appetite change, fatigue, fever and unexpected weight change.  ?HENT:  Negative for tinnitus and trouble swallowing.   ?Eyes:  Negative for visual disturbance.  ?Respiratory:  Negative for cough, chest tightness and shortness of breath.   ?Cardiovascular:  Negative for chest pain, palpitations and leg swelling.  ?Gastrointestinal:  Negative for abdominal pain.  ?Endocrine: Negative for polydipsia and polyuria.  ?Genitourinary:  Positive for vaginal discharge (intermittent yeast sx due to Jardiance tx). Negative for dysuria and hematuria.  ?Musculoskeletal:  Negative for arthralgias.  ?Neurological:  Negative for tremors, numbness and headaches.  ?Psychiatric/Behavioral:  Negative for dysphoric mood.   ? ?Patient Active Problem List  ? Diagnosis Date Noted  ? Portal hypertension ([place]) 06/25/2019  ? 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/10/2019  ? Type II diabetes mellitus with complication ([place]) 18/86/7737  ? Chronic right shoulder pain 03/03/2017  ? Adenomatous colon polyp 04/17/2016  ? FH: CAD (coronary artery disease) 04/17/2016  ? Essential (primary) hypertension 11/07/2014  ? Hyperlipidemia associated with type 2 diabetes mellitus ([place]) 11/07/2014  ? Cirrhosis of liver with ascites ([place]) 03/22/2014  ? Fibroid 10/03/2013  ? Cyst of right ovary 10/03/2013  ? ? ?Allergies  ?Allergen Reactions  ? Ozempic [Semaglutide] Nausea And Vomiting  ? Invokana [Canagliflozin] Itching  ? ? ?  Past Surgical History:  ?Procedure Laterality Date  ? BREAST REDUCTION SURGERY  1980  ? BREAST SURGERY    ? COLONOSCOPY WITH PROPOFOL N/A 12/31/2014  ? 19 polyps found  ? COLONOSCOPY WITH PROPOFOL N/A 06/05/2016  ? Procedure: COLONOSCOPY WITH PROPOFOL;  Surgeon: Lollie Sails, MD;  Location[facility] ENDOSCOPY;  Service: Endoscopy;  Laterality: N/A;  ? ? ?Social History   ? ?Tobacco Use  ? Smoking status: Former  ? Smokeless tobacco: Never  ?Vaping Use  ? Vaping Use: Never used  ?Substance Use Topics  ? Alcohol use: No  ?  Alcohol/week: 0.0 standard drinks  ? Drug use: No  ? ? ? ?Medication list has been reviewed and updated. ? ?Current Meds  ?Medication Sig  ? amLODipine (NORVASC) 10 MG tablet Take 1 tablet (10 mg total) by mouth daily.  ? atorvastatin (LIPITOR) 10 MG tablet TAKE 1 TABLET BY MOUTH DAILY .  ? Bioflavonoid Products (BIOFLEX PO) Take by mouth daily. For joint mobility  ? empagliflozin (JARDIANCE) 25 MG TABS tablet Take 25 mg by mouth daily.  ? Insulin Pen Needle (CARETOUCH PEN NEEDLES) 31G X 5 MM MISC 1 each by Does not apply route daily.  ? lisinopril-hydrochlorothiazide (ZESTORETIC) 20-12.5 MG tablet Take 1 tablet by mouth 2 (two) times daily.  ? metFORMIN (GLUCOPHAGE) 500 MG tablet Take 1 tablet (500 mg total) by mouth 2 (two) times daily with a meal.  ? SEMGLEE, YFGN, 100 UNIT/ML Pen Inject into the skin.  ? [DISCONTINUED] fluconazole (DIFLUCAN) 150 MG tablet Take 1 tablet (150 mg total) by mouth daily.  ? ? ?PHQ 2/9 Scores 07/15/2021 01/01/2021 07/16/2020 08/14/2019  ?PHQ - 2 Score 0 0 0 0  ?PHQ- 9 Score 0 0 0 0  ? ? ?GAD 7 : Generalized Anxiety Score 07/15/2021 01/01/2021 07/16/2020  ?Nervous, Anxious, on Edge 0 0 0  ?Control/stop worrying 0 0 0  ?Worry too much - different things 0 0 0  ?Trouble relaxing 0 0 0  ?Restless 0 0 0  ?Easily annoyed or irritable 0 0 0  ?Afraid - awful might happen 0 0 0  ?Total GAD 7 Score 0 0 0  ?Anxiety Difficulty Not difficult at all - -  ? ? ?BP Readings from Last 3 Encounters:  ?07/15/21 122/60  ?01/01/21 104/64  ?07/16/20 138/74  ? ? ?Physical Exam ?Vitals and nursing note reviewed.  ?Constitutional:   ?   General: She is not in acute distress. ?   Appearance: Normal appearance. She is well-developed.  ?HENT:  ?   Head: Normocephalic and atraumatic.  ?Cardiovascular:  ?   Rate and Rhythm: Normal rate and regular rhythm.  ?   Pulses: Normal  pulses.  ?   Heart sounds: No murmur heard. ?Pulmonary:  ?   Effort: Pulmonary effort is normal. No respiratory distress.  ?   Breath sounds: No wheezing or rhonchi.  ?Musculoskeletal:  ?   Cervical back: Normal range of motion and neck supple.  ?   Right lower leg: No edema.  ?   Left lower leg: No edema.  ?Skin: ?   General: Skin is warm and dry.  ?   Capillary Refill: Capillary refill takes less than 2 seconds.  ?   Findings: No rash.  ?Neurological:  ?   General: No focal deficit present.  ?   Mental Status: She is alert and oriented to person, place, and time.  ?Psychiatric:     ?   Mood and Affect: Mood normal.     ?  Behavior: Behavior normal.  ? ? ?Wt Readings from Last 3 Encounters:  ?07/15/21 221 lb (100.2 kg)  ?01/01/21 216 lb (98 kg)  ?07/16/20 216 lb (98 kg)  ? ? ?BP 122/60   Pulse 78   Ht _0  (1.702 m)   Wt 221 lb (100.2 kg)   SpO2 97%   BMI 34.61 kg/m?  ? ?Assessment and Plan: ?1. Essential (primary) hypertension ?Clinically stable exam with well controlled BP. ?Tolerating medications without side effects at this time. ?Pt to continue current regimen and low sodium diet; benefits of regular exercise as able discussed. ? ?2. Type II diabetes mellitus with complication (HCC) ?BS improving - on metformin, Jardiance, and insulin; waiting for Trulicity approval ?Last A1C = 8.1 ?Continue follow up with Endo ?Schedule DM eye exam ? ?3. Yeast vaginitis ?Continue once weekly suppression therapy ?- fluconazole (DIFLUCAN) 150 MG tablet; Take 1 tablet (150 mg total) by mouth daily.  Dispense: 7 tablet; Refill: 1 ? ? ?Partially dictated using Editor, commissioning. Any errors are unintentional. ? ?Halina Maidens, MD ?[facility] ?[facility] ? ?07/15/2021 ? ? ? ? ? ?

## 2021-09-01 ENCOUNTER — Other Ambulatory Visit: Payer: Self-pay | Admitting: Internal Medicine

## 2021-09-01 DIAGNOSIS — E118 Type 2 diabetes mellitus with unspecified complications: Secondary | ICD-10-CM

## 2021-09-01 DIAGNOSIS — I10 Essential (primary) hypertension: Secondary | ICD-10-CM

## 2021-09-01 MED ORDER — AMLODIPINE BESYLATE 10 MG PO TABS: 10.0000 mg | ORAL_TABLET | Freq: Every day | ORAL | 0 refills | Status: DC

## 2021-09-01 MED ORDER — LISINOPRIL-HYDROCHLOROTHIAZIDE 20-12.5 MG PO TABS: 1.0000 | ORAL_TABLET | Freq: Two times a day (BID) | ORAL | 0 refills | Status: DC

## 2021-09-01 MED ORDER — METFORMIN HCL 500 MG PO TABS: 500.0000 mg | ORAL_TABLET | Freq: Two times a day (BID) | ORAL | 0 refills | Status: DC

## 2021-09-02 DIAGNOSIS — I10 Essential (primary) hypertension: Secondary | ICD-10-CM | POA: Diagnosis not present

## 2021-09-02 DIAGNOSIS — E1165 Type 2 diabetes mellitus with hyperglycemia: Secondary | ICD-10-CM | POA: Diagnosis not present

## 2021-09-02 DIAGNOSIS — E669 Obesity, unspecified: Secondary | ICD-10-CM | POA: Diagnosis not present

## 2021-09-02 DIAGNOSIS — E1169 Type 2 diabetes mellitus with other specified complication: Secondary | ICD-10-CM | POA: Diagnosis not present

## 2021-10-20 DIAGNOSIS — K7469 Other cirrhosis of liver: Secondary | ICD-10-CM | POA: Diagnosis not present

## 2021-10-20 DIAGNOSIS — K766 Portal hypertension: Secondary | ICD-10-CM | POA: Diagnosis not present

## 2021-10-20 DIAGNOSIS — Z87891 Personal history of nicotine dependence: Secondary | ICD-10-CM | POA: Diagnosis not present

## 2021-10-20 DIAGNOSIS — Z23 Encounter for immunization: Secondary | ICD-10-CM | POA: Diagnosis not present

## 2021-10-20 DIAGNOSIS — K7581 Nonalcoholic steatohepatitis (NASH): Secondary | ICD-10-CM | POA: Diagnosis not present

## 2021-10-22 ENCOUNTER — Other Ambulatory Visit: Payer: Self-pay | Admitting: Internal Medicine

## 2021-10-22 DIAGNOSIS — I10 Essential (primary) hypertension: Secondary | ICD-10-CM

## 2021-10-22 NOTE — Telephone Encounter (Signed)
Requested medication (s) are due for refill today:   Yes  Requested medication (s) are on the active medication list:   Yes  Future visit scheduled:   Yes   Last ordered: 09/01/2021 #120, 0 refills  Returned because labs are due per protocol   Requested Prescriptions  Pending Prescriptions Disp Refills   lisinopril-hydrochlorothiazide (ZESTORETIC) 20-12.5 MG tablet [Pharmacy Med Name: Lisinopril-hydroCHLOROthiazide 20-12.5 MG Oral Tablet] 120 tablet 0    Sig: Take 1 tablet by mouth twice daily     Cardiovascular:  ACEI + Diuretic Combos Failed - 10/22/2021  9:17 AM      Failed - Na in normal range and within 180 days    Sodium  Date Value Ref Range Status  01/01/2021 142 134 - 144 mmol/L Final  12/12/2013 137 136 - 145 mmol/L Final         Failed - K in normal range and within 180 days    Potassium  Date Value Ref Range Status  01/01/2021 3.9 3.5 - 5.2 mmol/L Final  12/12/2013 3.9 3.5 - 5.1 mmol/L Final         Failed - Cr in normal range and within 180 days    Creatinine  Date Value Ref Range Status  12/12/2013 1.04 0.60 - 1.30 mg/dL Final   Creatinine, Ser  Date Value Ref Range Status  01/01/2021 0.88 0.57 - 1.00 mg/dL Final         Failed - eGFR is 30 or above and within 180 days    EGFR (African American)  Date Value Ref Range Status  12/12/2013 >60  Final   GFR calc Af Amer  Date Value Ref Range Status  08/14/2019 71 >59 mL/min/1.73 Final   EGFR (Non-African Amer.)  Date Value Ref Range Status  12/12/2013 >60  Final    Comment:    eGFR values <74m/min/1.73 m2 may be an indication of chronic kidney disease (CKD). Calculated eGFR is useful in patients with stable renal function. The eGFR calculation will not be reliable in acutely ill patients when serum creatinine is changing rapidly. It is not useful in  patients on dialysis. The eGFR calculation may not be applicable to patients at the low and high extremes of body sizes, pregnant women, and  vegetarians.    GFR calc non Af Amer  Date Value Ref Range Status  08/14/2019 62 >59 mL/min/1.73 Final   eGFR  Date Value Ref Range Status  01/01/2021 76 >59 mL/min/1.73 Final         Passed - Patient is not pregnant      Passed - Last BP in normal range    BP Readings from Last 1 Encounters:  07/15/21 122/60         Passed - Valid encounter within last 6 months    Recent Outpatient Visits           3 months [facility]   [facility]   [facility]   [facility]   2 years ago Essential (primary) hypertension   [place]       Future Appointments             In 2 months  Glean Hess, MD [facility], [facility]

## 2021-12-16 DIAGNOSIS — E1165 Type 2 diabetes mellitus with hyperglycemia: Secondary | ICD-10-CM | POA: Diagnosis not present

## 2021-12-16 LAB — HEMOGLOBIN A1C: Hemoglobin A1C: 6.5

## 2021-12-16 LAB — COMPREHENSIVE METABOLIC PANEL: eGFR: 73

## 2021-12-16 LAB — LIPID PANEL
Cholesterol: 145 (ref 0–200)
HDL: 42 (ref 35–70)
LDL Cholesterol: 85
Triglycerides: 92 (ref 40–160)

## 2021-12-16 LAB — PROTEIN / CREATININE RATIO, URINE
Albumin, U: 171
Creatinine, Urine: 16

## 2021-12-16 LAB — MICROALBUMIN / CREATININE URINE RATIO: Microalb Creat Ratio: 9.4

## 2021-12-18 ENCOUNTER — Other Ambulatory Visit: Payer: Self-pay | Admitting: Internal Medicine

## 2021-12-18 DIAGNOSIS — E118 Type 2 diabetes mellitus with unspecified complications: Secondary | ICD-10-CM

## 2021-12-18 DIAGNOSIS — I10 Essential (primary) hypertension: Secondary | ICD-10-CM

## 2021-12-18 MED ORDER — LISINOPRIL-HYDROCHLOROTHIAZIDE 20-12.5 MG PO TABS: 1.0000 | ORAL_TABLET | Freq: Two times a day (BID) | ORAL | 0 refills | Status: DC

## 2021-12-18 MED ORDER — AMLODIPINE BESYLATE 10 MG PO TABS: 10.0000 mg | ORAL_TABLET | Freq: Every day | ORAL | 0 refills | Status: DC

## 2021-12-18 MED ORDER — METFORMIN HCL 500 MG PO TABS: 500.0000 mg | ORAL_TABLET | Freq: Two times a day (BID) | ORAL | 0 refills | Status: AC

## 2021-12-23 DIAGNOSIS — Z794 Long term (current) use of insulin: Secondary | ICD-10-CM | POA: Diagnosis not present

## 2021-12-23 DIAGNOSIS — E1169 Type 2 diabetes mellitus with other specified complication: Secondary | ICD-10-CM | POA: Diagnosis not present

## 2021-12-23 DIAGNOSIS — E782 Mixed hyperlipidemia: Secondary | ICD-10-CM | POA: Diagnosis not present

## 2021-12-23 DIAGNOSIS — I10 Essential (primary) hypertension: Secondary | ICD-10-CM | POA: Diagnosis not present

## 2021-12-31 ENCOUNTER — Other Ambulatory Visit: Payer: Self-pay | Admitting: Internal Medicine

## 2022-01-01 ENCOUNTER — Encounter: Payer: Self-pay | Admitting: Internal Medicine

## 2022-01-01 ENCOUNTER — Ambulatory Visit (INDEPENDENT_AMBULATORY_CARE_PROVIDER_SITE_OTHER): Payer: BC Managed Care – PPO | Admitting: Internal Medicine

## 2022-01-01 VITALS — BP 110/60 | HR 77 | Ht 67.0 in | Wt 219.0 lb

## 2022-01-01 DIAGNOSIS — Z1231 Encounter for screening mammogram for malignant neoplasm of breast: Secondary | ICD-10-CM | POA: Diagnosis not present

## 2022-01-01 DIAGNOSIS — E1169 Type 2 diabetes mellitus with other specified complication: Secondary | ICD-10-CM

## 2022-01-01 DIAGNOSIS — Z0000 Encounter for general adult medical examination without abnormal findings: Secondary | ICD-10-CM

## 2022-01-01 DIAGNOSIS — E785 Hyperlipidemia, unspecified: Secondary | ICD-10-CM

## 2022-01-01 DIAGNOSIS — E118 Type 2 diabetes mellitus with unspecified complications: Secondary | ICD-10-CM

## 2022-01-01 DIAGNOSIS — I10 Essential (primary) hypertension: Secondary | ICD-10-CM

## 2022-01-01 DIAGNOSIS — K766 Portal hypertension: Secondary | ICD-10-CM

## 2022-01-01 NOTE — Progress Notes (Signed)
Date:  01/01/2022   Name:  Meagan Ibarra   DOB:  November 01, 1960   MRN:  [id number]   Chief Complaint: Annual Exam (Breast Exam. No pap smear.) Meagan Ibarra is a 61 y.o. female who presents today for her Complete Annual Exam. She feels well. She reports exercising - none. She reports she is sleeping well. Breast complaints - none.  Mammogram: 03/2021 [facility] DEXA: none Pap smear: 08/2019 neg/neg Colonoscopy: 06/2019 repeat 3 yrs  Health Maintenance Due  Topic Date Due   OPHTHALMOLOGY EXAM  04/24/2020   COVID-19 Vaccine (5 - Pfizer series) 05/13/2020   TETANUS/TDAP  02/01/2021   INFLUENZA VACCINE  12/09/2021   FOOT EXAM  01/01/2022    Immunization History  Administered Date(s) Administered   Hepatitis B 06/09/2015, 07/08/2015, 02/01/2016   Hepatitis B, adult 06/09/2015, 07/08/2015, 02/01/2016   Influenza,inj,Quad PF,6+ Mos 02/23/2018, 02/10/2019, 03/18/2020   Influenza-Unspecified 02/16/2018, 02/09/2019, 02/25/2021   PFIZER Comirnaty(Gray Top)Covid-19 Tri-Sucrose Vaccine 08/04/2019, 09/01/2019, 03/18/2020   PFIZER(Purple Top)SARS-COV-2 Vaccination 08/04/2019   PNEUMOCOCCAL CONJUGATE-20 10/20/2021   Pneumococcal Polysaccharide-23 02/22/2012   Tdap 02/02/2011   Zoster Recombinat (Shingrix) 06/27/2018, 10/10/2018    Hypertension This is a chronic problem. The problem is controlled. Pertinent negatives include no chest pain, headaches, palpitations or shortness of breath. Past treatments include calcium channel blockers, diuretics and ACE inhibitors. The current treatment provides significant improvement. There is no history of kidney disease, CAD/MI or CVA.  Hyperlipidemia This is a chronic problem. The problem is controlled. Pertinent negatives include no chest pain or shortness of breath. Current antihyperlipidemic treatment includes statins. The current treatment provides significant improvement of lipids.  Diabetes She presents for her follow-up diabetic visit. She has type  2 diabetes mellitus. Her disease course has been improving. Pertinent negatives for hypoglycemia include no dizziness, headaches, nervousness/anxiousness or tremors. Pertinent negatives for diabetes include no chest pain, no fatigue, no polydipsia and no polyuria. Pertinent negatives for diabetic complications include no CVA. Current diabetic treatments: metformin, mounjaro, jardiance, insulin. An ACE inhibitor/angiotensin II receptor blocker is being taken. Eye exam is not current.    Lab Results  Component Value Date   NA 142 01/01/2021   K 3.9 01/01/2021   CO2 24 01/01/2021   GLUCOSE 186 (H) 01/01/2021   BUN 15 01/01/2021   CREATININE 0.88 01/01/2021   CALCIUM 9.5 01/01/2021   EGFR 73 12/16/2021   GFRNONAA 62 08/14/2019   Lab Results  Component Value Date   CHOL 145 12/16/2021   HDL 42 12/16/2021   LDLCALC 85 12/16/2021   TRIG 92 12/16/2021   CHOLHDL 2.1 01/01/2021   Lab Results  Component Value Date   TSH 1.760 01/01/2021   Lab Results  Component Value Date   HGBA1C 6.5 12/16/2021   Lab Results  Component Value Date   WBC 7.6 01/01/2021   HGB 12.9 01/01/2021   HCT 39.6 01/01/2021   MCV 78 (L) 01/01/2021   PLT 122 (L) 01/01/2021   Lab Results  Component Value Date   ALT 41 (H) 01/01/2021   AST 41 (H) 01/01/2021   ALKPHOS 122 (H) 01/01/2021   BILITOT 0.9 01/01/2021   No results found for: "25OHVITD2", "25OHVITD3", "VD25OH"   Review of Systems  Constitutional:  Negative for chills, fatigue and fever.  HENT:  Negative for congestion, hearing loss, tinnitus, trouble swallowing and voice change.   Eyes:  Negative for visual disturbance.  Respiratory:  Negative for cough, chest tightness, shortness of breath and wheezing.  Cardiovascular:  Negative for chest pain, palpitations and leg swelling.  Gastrointestinal:  Negative for abdominal pain, constipation, diarrhea and vomiting.  Endocrine: Negative for polydipsia and polyuria.  Genitourinary:  Negative for  dysuria, frequency, genital sores, vaginal bleeding and vaginal discharge.  Musculoskeletal:  Negative for arthralgias, gait problem and joint swelling.  Skin:  Negative for color change and rash.  Neurological:  Negative for dizziness, tremors, light-headedness and headaches.  Hematological:  Negative for adenopathy. Does not bruise/bleed easily.  Psychiatric/Behavioral:  Negative for dysphoric mood and sleep disturbance. The patient is not nervous/anxious.     Patient Active Problem List   Diagnosis Date Noted   Portal hypertension ([place]) 06/25/2019   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/10/2019   Type II diabetes mellitus with complication ([place]) 11/94/1740   Chronic right shoulder pain 03/03/2017   Adenomatous colon polyp 04/17/2016   FH: CAD (coronary artery disease) 04/17/2016   Essential (primary) hypertension 11/07/2014   Hyperlipidemia associated with type 2 diabetes mellitus ([place]) 11/07/2014   Cirrhosis of liver with ascites ([place]) 03/22/2014   Fibroid 10/03/2013   Cyst of right ovary 10/03/2013    Allergies  Allergen Reactions   Ozempic [Semaglutide] Nausea And Vomiting   Invokana [Canagliflozin] Itching    Past Surgical History:  Procedure Laterality Date   BREAST REDUCTION SURGERY  1980   BREAST SURGERY     COLONOSCOPY WITH PROPOFOL N/A 12/31/2014   19 polyps found   COLONOSCOPY WITH PROPOFOL N/A 06/05/2016   Procedure: COLONOSCOPY WITH PROPOFOL;  Surgeon: Lollie Sails, MD;  Location: [facility] ENDOSCOPY;  Service: Endoscopy;  Laterality: N/A;    Social History   Tobacco Use   Smoking status: Former   Smokeless tobacco: Never  Scientific laboratory technician Use: Never used  Substance Use Topics   Alcohol use: No    Alcohol/week: 0.0 standard drinks of alcohol   Drug use: No     Medication list has been reviewed and updated.  Current Meds  Medication Sig   amLODipine (NORVASC) 10 MG tablet Take 1 tablet (10 mg total) by mouth daily.   atorvastatin  (LIPITOR) 10 MG tablet TAKE 1 TABLET BY MOUTH DAILY .   Bioflavonoid Products (BIOFLEX PO) Take by mouth daily. For joint mobility   empagliflozin (JARDIANCE) 25 MG TABS tablet Take 25 mg by mouth daily.   fluconazole (DIFLUCAN) 150 MG tablet Take 1 tablet (150 mg total) by mouth daily.   Insulin Pen Needle (CARETOUCH PEN NEEDLES) 31G X 5 MM MISC 1 each by Does not apply route daily.   lisinopril-hydrochlorothiazide (ZESTORETIC) 20-12.5 MG tablet Take 1 tablet by mouth 2 (two) times daily.   metFORMIN (GLUCOPHAGE) 500 MG tablet Take 1 tablet (500 mg total) by mouth 2 (two) times daily with a meal.   MOUNJARO 7.5 MG/0.5ML Pen SMARTSIG:7.5 Milligram(s) SUB-Q Once a Week   SEMGLEE, YFGN, 100 UNIT/ML Pen Inject into the skin.       01/01/2022    9:55 AM 07/15/2021   10:27 AM 01/01/2021    9:32 AM 07/16/2020   10:26 AM  GAD 7 : Generalized Anxiety Score  Nervous, Anxious, on Edge 0 0 0 0  Control/stop worrying 0 0 0 0  Worry too much - different things 0 0 0 0  Trouble relaxing 0 0 0 0  Restless 0 0 0 0  Easily annoyed or irritable 0 0 0 0  Afraid - awful might happen 0 0 0 0  Total GAD 7 Score  0 0 0 0  Anxiety Difficulty Not difficult at all Not difficult at all         01/01/2022    9:55 AM 07/15/2021   10:27 AM 01/01/2021    9:32 AM  Depression screen PHQ 2/9  Decreased Interest 0 0 0  Down, Depressed, Hopeless 0 0 0  PHQ - 2 Score 0 0 0  Altered sleeping 0 0 0  Tired, decreased energy 0 0 0  Change in appetite 0 0 0  Feeling bad or failure about yourself  0 0 0  Trouble concentrating 0 0 0  Moving slowly or fidgety/restless 0 0 0  Suicidal thoughts 0 0 0  PHQ-9 Score 0 0 0  Difficult doing work/chores Not difficult at all Not difficult at all Not difficult at all    BP Readings from Last 3 Encounters:  01/01/22 110/60  07/15/21 122/60  01/01/21 104/64    Physical Exam Vitals and nursing note reviewed.  Constitutional:      General: She is not in acute distress.     Appearance: She is well-developed.  HENT:     Head: Normocephalic and atraumatic.     Right Ear: Tympanic membrane and ear canal normal.     Left Ear: Tympanic membrane and ear canal normal.     Nose:     Right Sinus: No maxillary sinus tenderness.     Left Sinus: No maxillary sinus tenderness.  Eyes:     General: No scleral icterus.       Right eye: No discharge.        Left eye: No discharge.     Conjunctiva/sclera: Conjunctivae normal.  Neck:     Thyroid: No thyromegaly.     Vascular: No carotid bruit.  Cardiovascular:     Rate and Rhythm: Normal rate and regular rhythm.     Pulses: Normal pulses.     Heart sounds: Normal heart sounds.  Pulmonary:     Effort: Pulmonary effort is normal. No respiratory distress.     Breath sounds: No wheezing.  Chest:  Breasts:    Right: No mass, nipple discharge, skin change or tenderness.     Left: No mass, nipple discharge, skin change or tenderness.  Abdominal:     General: Bowel sounds are normal.     Palpations: Abdomen is soft. There is no fluid wave, hepatomegaly or splenomegaly.     Tenderness: There is no abdominal tenderness.  Musculoskeletal:     Cervical back: Normal range of motion. No erythema.     Right lower leg: No edema.     Left lower leg: No edema.  Lymphadenopathy:     Cervical: No cervical adenopathy.  Skin:    General: Skin is warm and dry.     Findings: No rash.  Neurological:     Mental Status: She is alert and oriented to person, place, and time.     Cranial Nerves: No cranial nerve deficit.     Sensory: No sensory deficit.     Deep Tendon Reflexes: Reflexes are normal and symmetric.  Psychiatric:        Attention and Perception: Attention normal.        Mood and Affect: Mood normal.     Wt Readings from Last 3 Encounters:  01/01/22 219 lb (99.3 kg)  07/15/21 221 lb (100.2 kg)  01/01/21 216 lb (98 kg)    BP 110/60   Pulse 77   Ht '5\' 7"'  (1.702 m)  Wt 219 lb (99.3 kg)   SpO2 96%   BMI 34.30  kg/m   Assessment and Plan: 1. Annual physical exam Exam is normal except for weight. Encourage regular exercise and appropriate dietary changes. Up to date on screenings and immunizations.  2. Encounter for screening mammogram for breast cancer Done annually by [facility] at work  3. Essential (primary) hypertension Clinically stable exam with well controlled BP. Tolerating medications without side effects at this time. Pt to continue current regimen and low sodium diet; benefits of regular exercise as able discussed.  4. Hyperlipidemia associated with type 2 diabetes mellitus ([place]) Tolerating statin medication without side effects at this time LDL is at goal of < 70 on current dose Continue same therapy without change at this time.  5. Type II diabetes mellitus with complication ([place]) Clinically stable by exam and report without s/s of hypoglycemia. DM complicated by hypertension and dyslipidemia. Tolerating medications well without side effects or other concerns. Reminded to schedule Diabetic retinal exam  6. Portal hypertension ([place]) Last EGD 05/2019 showed no varices.  She is due for repeat EGD. Cirrhosis being followed closely by [facility]. Clinically compensated.   Partially dictated using Editor, commissioning. Any errors are unintentional.  Halina Maidens, MD [facility]  01/01/2022

## 2022-01-13 DIAGNOSIS — N841 Polyp of cervix uteri: Secondary | ICD-10-CM | POA: Diagnosis not present

## 2022-01-13 DIAGNOSIS — R102 Pelvic and perineal pain: Secondary | ICD-10-CM | POA: Diagnosis not present

## 2022-01-20 ENCOUNTER — Other Ambulatory Visit: Payer: Self-pay | Admitting: Internal Medicine

## 2022-01-20 DIAGNOSIS — I10 Essential (primary) hypertension: Secondary | ICD-10-CM

## 2022-01-21 ENCOUNTER — Other Ambulatory Visit: Payer: Self-pay

## 2022-01-21 NOTE — Telephone Encounter (Signed)
Requested medication (s) are due for refill today: yes  Requested medication (s) are on the active medication list: yes  Last refill:  12/18/21 #60  Future visit scheduled: no  Notes to clinic:  overdue lab work- had CPE 2 weeks ago   Requested Prescriptions  Pending Prescriptions Disp Refills   lisinopril-hydrochlorothiazide (ZESTORETIC) 20-12.5 MG tablet [Pharmacy Med Name: Lisinopril-hydroCHLOROthiazide 20-12.5 MG Oral Tablet] 60 tablet 0    Sig: Take 1 tablet by mouth twice daily     Cardiovascular:  ACEI + Diuretic Combos Failed - 01/20/2022 10:01 AM      Failed - Na in normal range and within 180 days    Sodium  Date Value Ref Range Status  01/01/2021 142 134 - 144 mmol/L Final  12/12/2013 137 136 - 145 mmol/L Final         Failed - K in normal range and within 180 days    Potassium  Date Value Ref Range Status  01/01/2021 3.9 3.5 - 5.2 mmol/L Final  12/12/2013 3.9 3.5 - 5.1 mmol/L Final         Failed - Cr in normal range and within 180 days    Creatinine  Date Value Ref Range Status  12/12/2013 1.04 0.60 - 1.30 mg/dL Final   Creatinine, Ser  Date Value Ref Range Status  01/01/2021 0.88 0.57 - 1.00 mg/dL Final   Creatinine, Urine  Date Value Ref Range Status  12/16/2021 16  Final         Passed - eGFR is 30 or above and within 180 days    EGFR (African American)  Date Value Ref Range Status  12/12/2013 >60  Final   GFR calc Af Amer  Date Value Ref Range Status  08/14/2019 71 >59 mL/min/1.73 Final   EGFR (Non-African Amer.)  Date Value Ref Range Status  12/12/2013 >60  Final    Comment:    eGFR values <69m/min/1.73 m2 may be an indication of chronic kidney disease (CKD). Calculated eGFR is useful in patients with stable renal function. The eGFR calculation will not be reliable in acutely ill patients when serum creatinine is changing rapidly. It is not useful in  patients on dialysis. The eGFR calculation may not be applicable to patients at the  low and high extremes of body sizes, pregnant women, and vegetarians.    GFR calc non Af Amer  Date Value Ref Range Status  08/14/2019 62 >59 mL/min/1.73 Final   eGFR  Date Value Ref Range Status  12/16/2021 73  Final  01/01/2021 76 >59 mL/min/1.73 Final         Passed - Patient is not pregnant      Passed - Last BP in normal range    BP Readings from Last 1 Encounters:  01/01/22 110/60         Passed - Valid encounter within last 6 months    Recent Outpatient Visits           2 weeks ago Annual physical exam   [place] [facility]   6 months ago Essential (primary) hypertension   [place] [facility]   1 year ago Annual physical exam   [facility]   1 year ago Essential (primary) hypertension   [place] [facility], [facility]   2 years ago Annual physical exam   [facility] at [facility], Jesse Sans, MD

## 2022-02-06 ENCOUNTER — Other Ambulatory Visit: Payer: Self-pay | Admitting: Internal Medicine

## 2022-02-06 DIAGNOSIS — I10 Essential (primary) hypertension: Secondary | ICD-10-CM

## 2022-02-06 DIAGNOSIS — R102 Pelvic and perineal pain: Secondary | ICD-10-CM | POA: Diagnosis not present

## 2022-02-06 MED ORDER — AMLODIPINE BESYLATE 10 MG PO TABS: 10.0000 mg | ORAL_TABLET | Freq: Every day | ORAL | 2 refills | Status: DC

## 2022-02-26 ENCOUNTER — Other Ambulatory Visit: Payer: Self-pay | Admitting: Internal Medicine

## 2022-02-26 DIAGNOSIS — I10 Essential (primary) hypertension: Secondary | ICD-10-CM

## 2022-02-26 NOTE — Telephone Encounter (Signed)
Refilled 02/06/2022 #30 2 rf - confimred by same pharmacy. Requested Prescriptions  Pending Prescriptions Disp Refills  . amLODipine (NORVASC) 10 MG tablet [Pharmacy Med Name: amLODIPine Besylate 10 MG Oral Tablet] 90 tablet 0    Sig: Take 1 tablet by mouth once daily     Cardiovascular: Calcium Channel Blockers 2 Passed - 02/26/2022  9:18 AM      Passed - Last BP in normal range    BP Readings from Last 1 Encounters:  01/01/22 110/60         Passed - Last Heart Rate in normal range    Pulse Readings from Last 1 Encounters:  01/01/22 77         Passed - Valid encounter within last 6 months    Recent Outpatient Visits          1 month ago Annual physical exam   [place] [facility], Jesse Sans, MD   7 months ago Essential (primary) hypertension   [place] [facility] at [facility], Jesse Sans, MD   1 year ago Annual physical exam   [place] [facility] at [facility], Jesse Sans, MD   1 year ago Essential (primary) hypertension   [place] [facility] at [facility], Jesse Sans, MD   2 years ago Annual physical exam   [place] [facility] at [facility], Jesse Sans, MD

## 2022-02-27 ENCOUNTER — Other Ambulatory Visit: Payer: Self-pay | Admitting: Internal Medicine

## 2022-02-27 DIAGNOSIS — I10 Essential (primary) hypertension: Secondary | ICD-10-CM

## 2022-02-27 MED ORDER — LISINOPRIL-HYDROCHLOROTHIAZIDE 20-12.5 MG PO TABS: 1.0000 | ORAL_TABLET | Freq: Two times a day (BID) | ORAL | 0 refills | Status: DC

## 2022-03-17 DIAGNOSIS — E1169 Type 2 diabetes mellitus with other specified complication: Secondary | ICD-10-CM | POA: Diagnosis not present

## 2022-03-17 DIAGNOSIS — E669 Obesity, unspecified: Secondary | ICD-10-CM | POA: Diagnosis not present

## 2022-03-17 DIAGNOSIS — E782 Mixed hyperlipidemia: Secondary | ICD-10-CM | POA: Diagnosis not present

## 2022-03-17 DIAGNOSIS — Z794 Long term (current) use of insulin: Secondary | ICD-10-CM | POA: Diagnosis not present

## 2022-03-17 DIAGNOSIS — I10 Essential (primary) hypertension: Secondary | ICD-10-CM | POA: Diagnosis not present

## 2022-03-17 LAB — HEMOGLOBIN A1C: Hemoglobin A1C: 6.8

## 2022-03-20 DIAGNOSIS — Z1231 Encounter for screening mammogram for malignant neoplasm of breast: Secondary | ICD-10-CM | POA: Diagnosis not present

## 2022-04-23 DIAGNOSIS — K7469 Other cirrhosis of liver: Secondary | ICD-10-CM | POA: Diagnosis not present

## 2022-04-23 DIAGNOSIS — R161 Splenomegaly, not elsewhere classified: Secondary | ICD-10-CM | POA: Diagnosis not present

## 2022-04-23 DIAGNOSIS — K7460 Unspecified cirrhosis of liver: Secondary | ICD-10-CM | POA: Diagnosis not present

## 2022-04-23 DIAGNOSIS — R188 Other ascites: Secondary | ICD-10-CM | POA: Diagnosis not present

## 2022-04-23 DIAGNOSIS — I864 Gastric varices: Secondary | ICD-10-CM | POA: Diagnosis not present

## 2022-04-23 DIAGNOSIS — K766 Portal hypertension: Secondary | ICD-10-CM | POA: Diagnosis not present

## 2022-05-09 ENCOUNTER — Other Ambulatory Visit: Payer: Self-pay | Admitting: Internal Medicine

## 2022-05-09 DIAGNOSIS — I10 Essential (primary) hypertension: Secondary | ICD-10-CM

## 2022-05-10 NOTE — Telephone Encounter (Signed)
Requested Prescriptions  Pending Prescriptions Disp Refills   amLODipine (NORVASC) 10 MG tablet [Pharmacy Med Name: amLODIPine Besylate 10 MG Oral Tablet] 90 tablet 0    Sig: Take 1 tablet by mouth once daily     Cardiovascular: Calcium Channel Blockers 2 Passed - 05/09/2022 12:38 PM      Passed - Last BP in normal range    BP Readings from Last 1 Encounters:  01/01/22 110/60         Passed - Last Heart Rate in normal range    Pulse Readings from Last 1 Encounters:  01/01/22 77         Passed - Valid encounter within last 6 months    Recent Outpatient Visits           4 months ago Annual physical exam   [place] [facility] at [facility], Jesse Sans, MD   9 months ago Essential (primary) hypertension   [place] [facility] at [facility], Jesse Sans, MD   1 year ago Annual physical exam   [place] [facility] at [facility], Jesse Sans, MD   1 year ago Essential (primary) hypertension   [place] [facility] at [facility], Jesse Sans, MD   2 years ago Annual physical exam   [place] [facility] at [facility], Jesse Sans, MD

## 2022-05-21 ENCOUNTER — Other Ambulatory Visit: Payer: Self-pay | Admitting: Internal Medicine

## 2022-05-21 DIAGNOSIS — E1169 Type 2 diabetes mellitus with other specified complication: Secondary | ICD-10-CM

## 2022-05-22 NOTE — Telephone Encounter (Signed)
Requested Prescriptions  Pending Prescriptions Disp Refills   atorvastatin (LIPITOR) 10 MG tablet [Pharmacy Med Name: Atorvastatin Calcium '10mg'$  Tablet] 90 tablet 2    Sig: Take 1 tablet by mouth daily.     Cardiovascular:  Antilipid - Statins Failed - 05/21/2022  7:59 PM      Failed - Lipid Panel in normal range within the last 12 months    Cholesterol, Total  Date Value Ref Range Status  01/01/2021 112 100 - 199 mg/dL Final   Cholesterol  Date Value Ref Range Status  12/16/2021 145 0 - 200 Final   LDL Chol Calc (NIH)  Date Value Ref Range Status  01/01/2021 42 0 - 99 mg/dL Final   LDL Cholesterol  Date Value Ref Range Status  12/16/2021 85  Final   HDL  Date Value Ref Range Status  12/16/2021 42 35 - 70 Final  01/01/2021 53 >39 mg/dL Final   Triglycerides  Date Value Ref Range Status  12/16/2021 92 40 - 160 Final         Passed - Patient is not pregnant      Passed - Valid encounter within last 12 months    Recent Outpatient Visits           4 months ago Annual physical exam   [place] [facility] at [facility], Jesse Sans, MD   10 months ago Essential (primary) hypertension   [place] [facility] at [facility], Jesse Sans, MD   1 year ago Annual physical exam   [place] [facility] at [facility], Jesse Sans, MD   1 year ago Essential (primary) hypertension   [place] [facility] at [facility], Jesse Sans, MD   2 years ago Annual physical exam   [facility] at [facility], Jesse Sans, MD

## 2022-05-25 ENCOUNTER — Other Ambulatory Visit: Payer: Self-pay | Admitting: Internal Medicine

## 2022-05-25 DIAGNOSIS — I10 Essential (primary) hypertension: Secondary | ICD-10-CM

## 2022-05-25 NOTE — Telephone Encounter (Signed)
Please call pt to schedule an appt.  KP

## 2022-06-15 ENCOUNTER — Ambulatory Visit: Payer: BC Managed Care – PPO | Admitting: Internal Medicine

## 2022-06-19 ENCOUNTER — Encounter: Payer: Self-pay | Admitting: Internal Medicine

## 2022-06-19 ENCOUNTER — Ambulatory Visit (INDEPENDENT_AMBULATORY_CARE_PROVIDER_SITE_OTHER): Payer: BC Managed Care – PPO | Admitting: Internal Medicine

## 2022-06-19 VITALS — BP 112/62 | HR 60 | Ht 67.0 in | Wt 216.0 lb

## 2022-06-19 DIAGNOSIS — E785 Hyperlipidemia, unspecified: Secondary | ICD-10-CM

## 2022-06-19 DIAGNOSIS — E118 Type 2 diabetes mellitus with unspecified complications: Secondary | ICD-10-CM | POA: Diagnosis not present

## 2022-06-19 DIAGNOSIS — K766 Portal hypertension: Secondary | ICD-10-CM | POA: Diagnosis not present

## 2022-06-19 DIAGNOSIS — I10 Essential (primary) hypertension: Secondary | ICD-10-CM

## 2022-06-19 DIAGNOSIS — E1169 Type 2 diabetes mellitus with other specified complication: Secondary | ICD-10-CM

## 2022-06-19 DIAGNOSIS — B3731 Acute candidiasis of vulva and vagina: Secondary | ICD-10-CM

## 2022-06-19 MED ORDER — FLUCONAZOLE 150 MG PO TABS: 150.0000 mg | ORAL_TABLET | Freq: Every day | ORAL | 1 refills | Status: DC

## 2022-06-19 MED ORDER — CARVEDILOL 12.5 MG PO TABS: 12.5000 mg | ORAL_TABLET | Freq: Two times a day (BID) | ORAL | 1 refills | Status: DC

## 2022-06-19 NOTE — Patient Instructions (Signed)
Schedule your Diabetic Eye exam.

## 2022-06-19 NOTE — Assessment & Plan Note (Addendum)
Clinically stable exam with well controlled BP on lisinopril hct and amlodipine . Tolerating medications without side effects. Per Hepatology rec - begin Coreg 12.5 mg bid and stop amlodipine

## 2022-06-19 NOTE — Progress Notes (Unsigned)
Date:  06/19/2022   Name:  Meagan Ibarra   DOB:  1960/05/25   MRN:  [id number]   Chief Complaint: No chief complaint on file.  Hypertension This is a chronic problem. The problem is controlled. Pertinent negatives include no chest pain, headaches, palpitations or shortness of breath. Past treatments include calcium channel blockers, ACE inhibitors and diuretics. The current treatment provides significant improvement. There is no history of kidney disease, CAD/MI or CVA.  Hyperlipidemia This is a chronic problem. The problem is controlled (LDL 85). Pertinent negatives include no chest pain or shortness of breath. Current antihyperlipidemic treatment includes statins.    Lab Results  Component Value Date   NA 142 01/01/2021   K 3.9 01/01/2021   CO2 24 01/01/2021   GLUCOSE 186 (H) 01/01/2021   BUN 15 01/01/2021   CREATININE 0.88 01/01/2021   CALCIUM 9.5 01/01/2021   EGFR 73 12/16/2021   GFRNONAA 62 08/14/2019   Lab Results  Component Value Date   CHOL 145 12/16/2021   HDL 42 12/16/2021   LDLCALC 85 12/16/2021   TRIG 92 12/16/2021   CHOLHDL 2.1 01/01/2021   Lab Results  Component Value Date   TSH 1.760 01/01/2021   Lab Results  Component Value Date   HGBA1C 6.8 03/17/2022   Lab Results  Component Value Date   WBC 7.6 01/01/2021   HGB 12.9 01/01/2021   HCT 39.6 01/01/2021   MCV 78 (L) 01/01/2021   PLT 122 (L) 01/01/2021   Lab Results  Component Value Date   ALT 41 (H) 01/01/2021   AST 41 (H) 01/01/2021   ALKPHOS 122 (H) 01/01/2021   BILITOT 0.9 01/01/2021   No results found for: "25OHVITD2", "25OHVITD3", "VD25OH"   Review of Systems  Constitutional:  Negative for appetite change, fatigue, fever and unexpected weight change.  HENT:  Negative for tinnitus and trouble swallowing.   Eyes:  Negative for visual disturbance.  Respiratory:  Negative for cough, chest tightness and shortness of breath.   Cardiovascular:  Negative for chest pain, palpitations and leg  swelling.  Gastrointestinal:  Negative for abdominal pain.  Endocrine: Negative for polydipsia and polyuria.  Genitourinary:  Negative for dysuria and hematuria.  Musculoskeletal:  Negative for arthralgias.  Neurological:  Negative for tremors, numbness and headaches.  Psychiatric/Behavioral:  Negative for dysphoric mood.     Patient Active Problem List   Diagnosis Date Noted   Portal hypertension ([place]) 06/25/2019   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/10/2019   Type II diabetes mellitus with complication ([place]) Q000111Q   Chronic right shoulder pain 03/03/2017   Adenomatous colon polyp 04/17/2016   FH: CAD (coronary artery disease) 04/17/2016   Essential (primary) hypertension 11/07/2014   Hyperlipidemia associated with type 2 diabetes mellitus ([place]) 11/07/2014   Cirrhosis of liver with ascites ([place]) 03/22/2014   Fibroid 10/03/2013   Cyst of right ovary 10/03/2013    Allergies  Allergen Reactions   Ozempic [Semaglutide] Nausea And Vomiting   Invokana [Canagliflozin] Itching    Past Surgical History:  Procedure Laterality Date   BREAST REDUCTION SURGERY  1980   BREAST SURGERY     COLONOSCOPY WITH PROPOFOL N/A 12/31/2014   19 polyps found   COLONOSCOPY WITH PROPOFOL N/A 06/05/2016   Procedure: COLONOSCOPY WITH PROPOFOL;  Surgeon: Lollie Sails, MD;  Location: [facility] ENDOSCOPY;  Service: Endoscopy;  Laterality: N/A;    Social History   Tobacco Use   Smoking status: Former   Smokeless tobacco: Never  Vaping Use   Vaping Use: Never used  Substance Use Topics   Alcohol use: No    Alcohol/week: 0.0 standard drinks of alcohol   Drug use: No     Medication list has been reviewed and updated.  No outpatient medications have been marked as taking for the 06/19/22 encounter (Abstract) with Glean Hess, MD.       01/01/2022    9:55 AM 07/15/2021   10:27 AM 01/01/2021    9:32 AM 07/16/2020   10:26 AM  GAD 7 : Generalized Anxiety Score  Nervous, Anxious, on  Edge 0 0 0 0  Control/stop worrying 0 0 0 0  Worry too much - different things 0 0 0 0  Trouble relaxing 0 0 0 0  Restless 0 0 0 0  Easily annoyed or irritable 0 0 0 0  Afraid - awful might happen 0 0 0 0  Total GAD 7 Score 0 0 0 0  Anxiety Difficulty Not difficult at all Not difficult at all         01/01/2022    9:55 AM 07/15/2021   10:27 AM 01/01/2021    9:32 AM  Depression screen PHQ 2/9  Decreased Interest 0 0 0  Down, Depressed, Hopeless 0 0 0  PHQ - 2 Score 0 0 0  Altered sleeping 0 0 0  Tired, decreased energy 0 0 0  Change in appetite 0 0 0  Feeling bad or failure about yourself  0 0 0  Trouble concentrating 0 0 0  Moving slowly or fidgety/restless 0 0 0  Suicidal thoughts 0 0 0  PHQ-9 Score 0 0 0  Difficult doing work/chores Not difficult at all Not difficult at all Not difficult at all    BP Readings from Last 3 Encounters:  01/01/22 110/60  07/15/21 122/60  01/01/21 104/64    Physical Exam Vitals and nursing note reviewed.  Constitutional:      General: She is not in acute distress.    Appearance: She is well-developed.  HENT:     Head: Normocephalic and atraumatic.  Pulmonary:     Effort: Pulmonary effort is normal. No respiratory distress.  Skin:    General: Skin is warm and dry.     Findings: No rash.  Neurological:     Mental Status: She is alert and oriented to person, place, and time.  Psychiatric:        Mood and Affect: Mood normal.        Behavior: Behavior normal.     Wt Readings from Last 3 Encounters:  01/01/22 219 lb (99.3 kg)  07/15/21 221 lb (100.2 kg)  01/01/21 216 lb (98 kg)    There were no vitals taken for this visit.  Assessment and Plan:

## 2022-06-19 NOTE — Assessment & Plan Note (Signed)
Recent scan stable Hepatologist recommends Coreg so will stop amlodipine and begin Coreg 12.5 mg bid

## 2022-06-19 NOTE — Assessment & Plan Note (Addendum)
Followed by Endo Last A1C 6.8 Eye exam due Recommend CGM - pt will call insurance to see which is covered

## 2022-06-19 NOTE — Assessment & Plan Note (Signed)
Followed by Endo Last A1C 6.8 Eye exam due

## 2022-06-19 NOTE — Assessment & Plan Note (Signed)
On statin medications with LDL 88

## 2022-06-19 NOTE — Assessment & Plan Note (Signed)
Clinically stable exam with well controlled BP on lisinopril hct and amlodipine . Tolerating medications without side effects. Pt to continue current regimen and low sodium diet.

## 2022-06-19 NOTE — Assessment & Plan Note (Signed)
LDL not quite at goal (88) on atorvastatin 10 mg

## 2022-06-19 NOTE — Progress Notes (Signed)
Date:  06/19/2022   Name:  Meagan Ibarra   DOB:  [birth year]   MRN:  [id number]   Chief Complaint: Hypertension  Hypertension This is a chronic problem. The problem is controlled. Pertinent negatives include no chest pain, headaches, palpitations or shortness of breath. Past treatments include calcium channel blockers, ACE inhibitors and diuretics. There is no history of kidney disease, CAD/MI or CVA.  Diabetes She presents for her follow-up diabetic visit. She has type 2 diabetes mellitus. Her disease course has been stable. Pertinent negatives for hypoglycemia include no dizziness, headaches or nervousness/anxiousness. Pertinent negatives for diabetes include no chest pain, no fatigue and no weakness. Pertinent negatives for diabetic complications include no CVA. Current diabetic treatments: metformin, jardiance, Semglee and Mounjaro. Her weight is stable. Her breakfast blood glucose is taken between 6-7 am. Her breakfast blood glucose range is generally 110-130 mg/dl. An ACE inhibitor/angiotensin II receptor blocker is being taken. Eye exam is not current.  Her liver specialist recommended changing amlodipine to Coreg for prevention of portal hypertension.  She is willing to make the change.    Lab Results  Component Value Date   NA 142 01/01/2021   K 3.9 01/01/2021   CO2 24 01/01/2021   GLUCOSE 186 (H) 01/01/2021   BUN 15 01/01/2021   CREATININE 0.88 01/01/2021   CALCIUM 9.5 01/01/2021   EGFR 73 12/16/2021   GFRNONAA 62 08/14/2019   Lab Results  Component Value Date   CHOL 145 12/16/2021   HDL 42 12/16/2021   LDLCALC 85 12/16/2021   TRIG 92 12/16/2021   CHOLHDL 2.1 01/01/2021   Lab Results  Component Value Date   TSH 1.760 01/01/2021   Lab Results  Component Value Date   HGBA1C 6.8 03/17/2022   Lab Results  Component Value Date   WBC 7.6 01/01/2021   HGB 12.9 01/01/2021   HCT 39.6 01/01/2021   MCV 78 (L) 01/01/2021   PLT 122 (L) 01/01/2021   Lab Results   Component Value Date   ALT 41 (H) 01/01/2021   AST 41 (H) 01/01/2021   ALKPHOS 122 (H) 01/01/2021   BILITOT 0.9 01/01/2021   No results found for: "25OHVITD2", "25OHVITD3", "VD25OH"   Review of Systems  Constitutional:  Negative for fatigue and unexpected weight change.  HENT:  Negative for nosebleeds.   Eyes:  Negative for visual disturbance.  Respiratory:  Negative for cough, chest tightness, shortness of breath and wheezing.   Cardiovascular:  Negative for chest pain, palpitations and leg swelling.  Gastrointestinal:  Negative for abdominal pain, constipation and diarrhea.  Genitourinary:        Occasional vaginal yeast infections  Neurological:  Negative for dizziness, weakness, light-headedness and headaches.  Psychiatric/Behavioral:  Negative for dysphoric mood and sleep disturbance. The patient is not nervous/anxious.     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   Fibroid 10/03/2013   Cyst of right ovary 10/03/2013    Allergies  Allergen Reactions   Ozempic [Semaglutide] Nausea And Vomiting   Invokana [Canagliflozin] Itching    Past Surgical  History:  Procedure Laterality Date   BREAST REDUCTION SURGERY  1980   BREAST SURGERY     COLONOSCOPY WITH PROPOFOL N/A 12/31/2014   19 polyps found   COLONOSCOPY WITH PROPOFOL N/A 06/05/2016   Procedure: COLONOSCOPY WITH PROPOFOL;  Surgeon: Lollie Sails, MD;  [facility] END[facility]COPY;  Service: Endoscopy;  Laterality: N/A;    Social History   Tobacco Use    Smoking status: Former   Smokeless tobacco: Never  Scientific laboratory technician Use: Never used  Substance Use Topics   Alcohol use: No    Alcohol/week: 0.0 standard drinks of alcohol   Drug use: No     Medication list has been reviewed and updated.  Current Meds  Medication Sig   atorvastatin (LIPITOR) 10 MG tablet Take 1 tablet by mouth daily.   Bioflavonoid Products (BIOFLEX PO) Take by mouth daily. For joint mobility   carvedilol (COREG) 12.5 MG tablet Take 1 tablet (12.5 mg total) by mouth 2 (two) times daily with a meal.   empagliflozin (JARDIANCE) 25 MG TABS tablet Take 25 mg by mouth daily.   Insulin Pen Needle (CARETOUCH PEN NEEDLES) 31G X 5 MM MISC 1 each by Does not apply route daily.   lisinopril-hydrochlorothiazide (ZESTORETIC) 20-12.5 MG tablet Take 1 tablet by mouth twice daily   metFORMIN (GLUCOPHAGE) 500 MG tablet Take 1 tablet (500 mg total) by mouth 2 (two) times daily with a meal.   MOUNJARO 7.5 MG/0.5ML Pen SMARTSIG:7.5 Milligram(s) SUB-Q Once a Week   SEMGLEE, YFGN, 100 UNIT/ML Pen Inject into the skin.   [DISCONTINUED] amLODipine (NORVASC) 10 MG tablet Take 1 tablet by mouth once daily   [DISCONTINUED] fluconazole (DIFLUCAN) 150 MG tablet Take 1 tablet (150 mg total) by mouth daily.       01/01/2022    9:55 AM 07/15/2021   10:27 AM 01/01/2021    9:32 AM 07/16/2020   10:26 AM  GAD 7 : Generalized Anxiety Score  Nervous, Anxious, on Edge 0 0 0 0  Control/stop worrying 0 0 0 0  Worry too much - different things 0 0 0 0  Trouble relaxing 0 0 0 0  Restless 0 0 0 0  Easily annoyed or irritable 0 0 0 0  Afraid - awful might happen 0 0 0 0  Total GAD 7 Score 0 0 0 0  Anxiety Difficulty Not difficult at all Not difficult at all         01/01/2022    9:55 AM 07/15/2021   10:27 AM 01/01/2021    9:32 AM  Depression screen PHQ 2/9  Decreased Interest 0 0 0  Down, Depressed, Hopeless 0 0 0  PHQ - 2 Score 0 0 0  Altered sleeping 0 0 0  Tired, decreased energy 0 0 0   Change in appetite 0 0 0  Feeling bad or failure about yourself  0 0 0  Trouble concentrating 0 0 0  Moving slowly or fidgety/restless 0 0 0  Suicidal thoughts 0 0 0  PHQ-9 Score 0 0 0  Difficult doing work/chores Not difficult at all Not difficult at all Not difficult at all    BP Readings from Last 3 Encounters:  06/19/22 112/62  01/01/22 110/60  07/15/21 122/60    Physical Exam Vitals and nursing note reviewed.  Constitutional:      General: She is not in acute distress.    Appearance: Normal appearance. She is well-developed.  HENT:     Head: Normocephalic  and atraumatic.  Neck:     Vascular: No carotid bruit.  Cardiovascular:     Rate and Rhythm: Normal rate and regular rhythm.     Pulses: Normal pulses.  Pulmonary:     Effort: Pulmonary effort is normal. No respiratory distress.     Breath sounds: No wheezing or rhonchi.  Musculoskeletal:     Cervical back: Normal range of motion.     Right lower leg: No edema.     Left lower leg: No edema.  Lymphadenopathy:     Cervical: No cervical adenopathy.  Skin:    General: Skin is warm and dry.     Findings: No rash.  Neurological:     General: No focal deficit present.     Mental Status: She is alert and oriented to person, place, and time.  Psychiatric:        Mood and Affect: Mood normal.        Behavior: Behavior normal.     Wt Readings from Last 3 Encounters:  06/19/22 216 lb (98 kg)  01/01/22 219 lb (99.3 kg)  07/15/21 221 lb (100.2 kg)    BP 112/62   Pulse 60   Ht 5' 7"$  (1.702 m)   Wt 216 lb (98 kg)   BMI 33.83 kg/m   Assessment and Plan: Problem List Items Addressed This Visit       Cardiovascular and Mediastinum   Essential (primary) hypertension - Primary (Chronic)    Clinically stable exam with well controlled BP on lisinopril hct and amlodipine . Tolerating medications without side effects. Per Hepatology rec - begin Coreg 12.5 mg bid and stop amlodipine        Relevant Medications    carvedilol (COREG) 12.5 MG tablet   Portal hypertension (H[facility]) (Chronic)    Recent scan stable Hepatologist recommends Coreg so will stop amlodipine and begin Coreg 12.5 mg bid      Relevant Medications   carvedilol (COREG) 12.5 MG tablet     Endocrine   Hyperlipidemia associated with type 2 diabetes mellitus (HCC) (Chronic)    On statin medications with LDL 88      Relevant Medications   carvedilol (COREG) 12.5 MG tablet   Type II diabetes mellitus with complication (HCC) (Chronic)    Followed by Endo Last A1C 6.8 Eye exam due Recommend CGM - pt will call insurance to see which is covered      Other Visit Diagnoses     Yeast vaginitis       Relevant Medications   fluconazole (DIFLUCAN) 150 MG tablet        Partially dictated using Editor, commissioning. Any errors are unintentional.  Halina Maidens, MD [facility]  06/19/2022

## 2022-06-29 DIAGNOSIS — E1169 Type 2 diabetes mellitus with other specified complication: Secondary | ICD-10-CM | POA: Diagnosis not present

## 2022-06-29 DIAGNOSIS — I10 Essential (primary) hypertension: Secondary | ICD-10-CM | POA: Diagnosis not present

## 2022-06-29 DIAGNOSIS — E669 Obesity, unspecified: Secondary | ICD-10-CM | POA: Diagnosis not present

## 2022-06-29 DIAGNOSIS — E782 Mixed hyperlipidemia: Secondary | ICD-10-CM | POA: Diagnosis not present

## 2022-06-30 DIAGNOSIS — E1129 Type 2 diabetes mellitus with other diabetic kidney complication: Secondary | ICD-10-CM | POA: Diagnosis not present

## 2022-06-30 DIAGNOSIS — E1169 Type 2 diabetes mellitus with other specified complication: Secondary | ICD-10-CM | POA: Diagnosis not present

## 2022-06-30 DIAGNOSIS — I10 Essential (primary) hypertension: Secondary | ICD-10-CM | POA: Diagnosis not present

## 2022-06-30 DIAGNOSIS — E782 Mixed hyperlipidemia: Secondary | ICD-10-CM | POA: Diagnosis not present

## 2022-07-11 DIAGNOSIS — H524 Presbyopia: Secondary | ICD-10-CM | POA: Diagnosis not present

## 2022-07-11 DIAGNOSIS — E113293 Type 2 diabetes mellitus with mild nonproliferative diabetic retinopathy without macular edema, bilateral: Secondary | ICD-10-CM | POA: Diagnosis not present

## 2022-08-16 ENCOUNTER — Other Ambulatory Visit: Payer: Self-pay | Admitting: Internal Medicine

## 2022-08-16 DIAGNOSIS — I10 Essential (primary) hypertension: Secondary | ICD-10-CM

## 2022-08-18 NOTE — Telephone Encounter (Signed)
Requested Prescriptions  Pending Prescriptions Disp Refills   lisinopril-hydrochlorothiazide (ZESTORETIC) 20-12.5 MG tablet [Pharmacy Med Name: Lisinopril-hydroCHLOROthiazide 20-12.5 MG Oral Tablet] 180 tablet 0    Sig: Take 1 tablet by mouth twice daily     Cardiovascular:  ACEI + Diuretic Combos Failed - 08/16/2022  7:51 AM      Failed - Na in normal range and within 180 days    Sodium  Date Value Ref Range Status  01/01/2021 142 134 - 144 mmol/L Final  12/12/2013 137 136 - 145 mmol/L Final         Failed - K in normal range and within 180 days    Potassium  Date Value Ref Range Status  01/01/2021 3.9 3.5 - 5.2 mmol/L Final  12/12/2013 3.9 3.5 - 5.1 mmol/L Final         Failed - Cr in normal range and within 180 days    Creatinine  Date Value Ref Range Status  12/12/2013 1.04 0.60 - 1.30 mg/dL Final   Creatinine, Ser  Date Value Ref Range Status  01/01/2021 0.88 0.57 - 1.00 mg/dL Final   Creatinine, Urine  Date Value Ref Range Status  12/16/2021 16  Final         Failed - eGFR is 30 or above and within 180 days    EGFR (African American)  Date Value Ref Range Status  12/12/2013 >60  Final   GFR calc Af Amer  Date Value Ref Range Status  08/14/2019 71 >59 mL/min/1.73 Final   EGFR (Non-African Amer.)  Date Value Ref Range Status  12/12/2013 >60  Final    Comment:    eGFR values <41mL/min/1.73 m2 may be an indication of chronic kidney disease (CKD). Calculated eGFR is useful in patients with stable renal function. The eGFR calculation will not be reliable in acutely ill patients when serum creatinine is changing rapidly. It is not useful in  patients on dialysis. The eGFR calculation may not be applicable to patients at the low and high extremes of body sizes, pregnant women, and vegetarians.    GFR calc non Af Amer  Date Value Ref Range Status  08/14/2019 62 >59 mL/min/1.73 Final   eGFR  Date Value Ref Range Status  12/16/2021 73  Final  01/01/2021 76  >59 mL/min/1.73 Final         Passed - Patient is not pregnant      Passed - Last BP in normal range    BP Readings from Last 1 Encounters:  06/19/22 112/62         Passed - Valid encounter within last 6 months    Recent Outpatient Visits           2 months ago Essential (primary) hypertension   [place] [facility] at [facility], Nyoka Cowden, MD   7 months ago Annual physical exam   [facility] at [facility], Nyoka Cowden, MD   1 year ago Essential (primary) hypertension   [place] [facility] at [facility], Nyoka Cowden, MD   1 year ago Annual physical exam   [facility] at [facility], Nyoka Cowden, MD   2 years ago Essential (primary) hypertension   [place] [facility] at [facility], Nyoka Cowden, MD       Future Appointments  In 4 months Judithann Graves, Nyoka Cowden, MD [facility] at [facility], [facility]

## 2022-10-23 DIAGNOSIS — K7581 Nonalcoholic steatohepatitis (NASH): Secondary | ICD-10-CM | POA: Diagnosis not present

## 2022-12-21 DIAGNOSIS — E1129 Type 2 diabetes mellitus with other diabetic kidney complication: Secondary | ICD-10-CM | POA: Diagnosis not present

## 2022-12-21 DIAGNOSIS — E782 Mixed hyperlipidemia: Secondary | ICD-10-CM | POA: Diagnosis not present

## 2022-12-21 DIAGNOSIS — E1169 Type 2 diabetes mellitus with other specified complication: Secondary | ICD-10-CM | POA: Diagnosis not present

## 2022-12-21 DIAGNOSIS — E669 Obesity, unspecified: Secondary | ICD-10-CM | POA: Diagnosis not present

## 2022-12-21 DIAGNOSIS — I10 Essential (primary) hypertension: Secondary | ICD-10-CM | POA: Diagnosis not present

## 2022-12-21 DIAGNOSIS — R809 Proteinuria, unspecified: Secondary | ICD-10-CM | POA: Diagnosis not present

## 2022-12-21 LAB — COMPREHENSIVE METABOLIC PANEL: eGFR: 64

## 2022-12-21 LAB — LIPID PANEL
Cholesterol: 104 (ref 0–200)
HDL: 38 (ref 35–70)
LDL Cholesterol: 34
Triglycerides: 161 — AB (ref 40–160)

## 2022-12-21 LAB — CBC AND DIFFERENTIAL
HCT: 40 (ref 36–46)
Hemoglobin: 12.4 (ref 12.0–16.0)
Platelets: 69 10*3/uL — AB (ref 150–400)
WBC: 4.5

## 2022-12-21 LAB — BASIC METABOLIC PANEL
BUN: 30 — AB (ref 4–21)
CO2: 28 — AB (ref 13–22)
Chloride: 103 (ref 99–108)
Creatinine: 1 (ref 0.5–1.1)
Glucose: 148
Potassium: 3.5 mEq/L (ref 3.5–5.1)
Sodium: 139 (ref 137–147)

## 2022-12-21 LAB — HEPATIC FUNCTION PANEL
ALT: 37 U/L — AB (ref 7–35)
AST: 39 — AB (ref 13–35)

## 2022-12-21 LAB — MICROALBUMIN / CREATININE URINE RATIO: Microalb Creat Ratio: 8.6

## 2022-12-21 LAB — PROTEIN / CREATININE RATIO, URINE
Albumin, U: 10
Creatinine, Urine: 116

## 2022-12-25 ENCOUNTER — Ambulatory Visit (INDEPENDENT_AMBULATORY_CARE_PROVIDER_SITE_OTHER): Payer: BC Managed Care – PPO | Admitting: Internal Medicine

## 2022-12-25 ENCOUNTER — Encounter: Payer: Self-pay | Admitting: Internal Medicine

## 2022-12-25 VITALS — BP 120/76 | HR 73 | Ht 67.0 in | Wt 217.0 lb

## 2022-12-25 DIAGNOSIS — Z1211 Encounter for screening for malignant neoplasm of colon: Secondary | ICD-10-CM | POA: Diagnosis not present

## 2022-12-25 DIAGNOSIS — E118 Type 2 diabetes mellitus with unspecified complications: Secondary | ICD-10-CM | POA: Diagnosis not present

## 2022-12-25 DIAGNOSIS — Z0000 Encounter for general adult medical examination without abnormal findings: Secondary | ICD-10-CM

## 2022-12-25 DIAGNOSIS — E1169 Type 2 diabetes mellitus with other specified complication: Secondary | ICD-10-CM

## 2022-12-25 DIAGNOSIS — I10 Essential (primary) hypertension: Secondary | ICD-10-CM

## 2022-12-25 DIAGNOSIS — Z1231 Encounter for screening mammogram for malignant neoplasm of breast: Secondary | ICD-10-CM | POA: Diagnosis not present

## 2022-12-25 DIAGNOSIS — B3731 Acute candidiasis of vulva and vagina: Secondary | ICD-10-CM

## 2022-12-25 DIAGNOSIS — K766 Portal hypertension: Secondary | ICD-10-CM

## 2022-12-25 DIAGNOSIS — Z7984 Long term (current) use of oral hypoglycemic drugs: Secondary | ICD-10-CM

## 2022-12-25 DIAGNOSIS — E785 Hyperlipidemia, unspecified: Secondary | ICD-10-CM

## 2022-12-25 LAB — POCT GLYCOSYLATED HEMOGLOBIN (HGB A1C): Hemoglobin A1C: 6.9 % — AB (ref 4.0–5.6)

## 2022-12-25 MED ORDER — CARVEDILOL 12.5 MG PO TABS
12.5000 mg | ORAL_TABLET | Freq: Two times a day (BID) | ORAL | 1 refills | Status: AC
Start: 2022-12-25 — End: ?

## 2022-12-25 MED ORDER — FLUCONAZOLE 150 MG PO TABS
150.0000 mg | ORAL_TABLET | ORAL | 1 refills | Status: DC
Start: 2022-12-25 — End: 2023-09-03

## 2022-12-25 MED ORDER — LISINOPRIL-HYDROCHLOROTHIAZIDE 20-12.5 MG PO TABS
1.0000 | ORAL_TABLET | Freq: Two times a day (BID) | ORAL | 1 refills | Status: DC
Start: 2022-12-25 — End: 2023-02-15

## 2022-12-25 MED ORDER — ATORVASTATIN CALCIUM 10 MG PO TABS
10.0000 mg | ORAL_TABLET | Freq: Every day | ORAL | 3 refills | Status: DC
Start: 2022-12-25 — End: 2023-01-22

## 2022-12-25 NOTE — Assessment & Plan Note (Signed)
LDL is  Lab Results  Component Value Date   LDLCALC 34 12/21/2022   Currently being treated with atorvastatin with good compliance and no concerns.

## 2022-12-25 NOTE — Progress Notes (Signed)
Date:  12/25/2022   Name:  Meagan Ibarra   DOB:  04-May-1961   MRN:  [id number]   Chief Complaint: Annual Exam Meagan Ibarra is a 62 y.o. female who presents today for her Complete Annual Exam. She feels well. She reports exercising. She reports she is sleeping well. Breast complaints - none.  Mammogram: 03/2022  [facility] mobile unit DEXA: none Pap smear: 08/2019  neg/neg Colonoscopy: 06/2019 repeat 3 yrs  Health Maintenance Due  Topic Date Due   OPHTHALMOLOGY EXAM  04/24/2020   DTaP/Tdap/Td (2 - Td or Tdap) 02/01/2021   Colonoscopy  06/12/2022   INFLUENZA VACCINE  12/10/2022    Immunization History  Administered Date(s) Administered   Hepatitis B 06/09/2015, 07/08/2015, 02/01/2016   Hepatitis B, ADULT 06/09/2015, 07/08/2015, 02/01/2016   Influenza Inj Mdck Quad Pf 03/19/2016   Influenza,inj,Quad PF,6+ Mos 02/23/2018, 02/10/2019, 03/18/2020   Influenza-Unspecified 02/16/2018, 02/09/2019, 02/25/2021   PFIZER Comirnaty(Gray Top)Covid-19 Tri-Sucrose Vaccine 08/04/2019, 09/01/2019, 03/18/2020   PFIZER(Purple Top)SARS-COV-2 Vaccination 08/04/2019   PNEUMOCOCCAL CONJUGATE-20 10/20/2021   Pneumococcal Polysaccharide-23 02/22/2012   Tdap 02/02/2011   Zoster Recombinant(Shingrix) 06/27/2018, 10/10/2018    Diabetes She presents for her follow-up diabetic visit. She has type 2 diabetes mellitus. Her disease course has been stable. Pertinent negatives for hypoglycemia include no dizziness, headaches, nervousness/anxiousness or tremors. Pertinent negatives for diabetes include no chest pain, no fatigue, no polydipsia and no polyuria.  Hypertension This is a chronic problem. The problem is controlled. Pertinent negatives include no chest pain, headaches, palpitations or shortness of breath.  Hyperlipidemia This is a chronic problem. The problem is controlled. Pertinent negatives include no chest pain or shortness of breath. Current antihyperlipidemic treatment includes statins.  NASH - being  followed by [facility].  AFP every 6 months and MRI yearly.  Lab Results  Component Value Date   NA 139 12/21/2022   K 3.5 12/21/2022   CO2 28 (A) 12/21/2022   GLUCOSE 186 (H) 01/01/2021   BUN 30 (A) 12/21/2022   CREATININE 1.0 12/21/2022   CALCIUM 9.5 01/01/2021   EGFR 64 12/21/2022   GFRNONAA 62 08/14/2019   Lab Results  Component Value Date   CHOL 104 12/21/2022   HDL 38 12/21/2022   LDLCALC 34 12/21/2022   TRIG 161 (A) 12/21/2022   CHOLHDL 2.1 01/01/2021   Lab Results  Component Value Date   TSH 1.760 01/01/2021   Lab Results  Component Value Date   HGBA1C 6.9 (A) 12/25/2022   Lab Results  Component Value Date   WBC 4.5 12/21/2022   HGB 12.4 12/21/2022   HCT 40 12/21/2022   MCV 78 (L) 01/01/2021   PLT 69 (A) 12/21/2022   Lab Results  Component Value Date   ALT 37 (A) 12/21/2022   AST 39 (A) 12/21/2022   ALKPHOS 122 (H) 01/01/2021   BILITOT 0.9 01/01/2021   No results found for: "25OHVITD2", "25OHVITD3", "VD25OH"   Review of Systems  Constitutional:  Negative for chills, fatigue and fever.  HENT:  Negative for congestion, hearing loss, tinnitus, trouble swallowing and voice change.   Eyes:  Negative for visual disturbance.  Respiratory:  Negative for cough, chest tightness, shortness of breath and wheezing.   Cardiovascular:  Negative for chest pain, palpitations and leg swelling.  Gastrointestinal:  Negative for abdominal pain, constipation, diarrhea and vomiting.  Endocrine: Negative for polydipsia and polyuria.  Genitourinary:  Negative for dysuria, frequency, genital sores, vaginal bleeding and vaginal discharge.  Musculoskeletal:  Negative for  arthralgias, gait problem and joint swelling.  Skin:  Negative for color change and rash.  Neurological:  Negative for dizziness, tremors, light-headedness and headaches.  Hematological:  Negative for adenopathy. Does not bruise/bleed easily.  Psychiatric/Behavioral:  Negative for dysphoric mood and  sleep disturbance. The patient is not nervous/anxious.     Patient Active Problem List   Diagnosis Date Noted   Portal hypertension (HCC) 06/25/2019   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/10/2019   Type II diabetes mellitus with complication (HCC) 02/24/2018   Chronic right shoulder pain 03/03/2017   Adenomatous colon polyp 04/17/2016   FH: CAD (coronary artery disease) 04/17/2016   Essential (primary) hypertension 11/07/2014   Hyperlipidemia associated with type 2 diabetes mellitus (HCC) 11/07/2014   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/22/2014   Fibroid 10/03/2013   Cyst of right ovary 10/03/2013    Allergies  Allergen Reactions   Ozempic [Semaglutide] Nausea And Vomiting   Invokana [Canagliflozin] Itching    Past Surgical History:  Procedure Laterality Date   BREAST REDUCTION SURGERY  1980   BREAST SURGERY     COLONOSCOPY WITH PROPOFOL N/A 12/31/2014   19 polyps found   COLONOSCOPY WITH PROPOFOL N/A 06/05/2016   Procedure: COLONOSCOPY WITH PROPOFOL;  Surgeon: Christena Deem, MD;  Location: [facility] END[facility]COPY;  Service: Endoscopy;  Laterality: N/A;    Social History   Tobacco Use   Smoking status: Former   Smokeless tobacco: Never  Vaping Use   Vaping status: Never Used  Substance Use Topics   Alcohol use: No    Alcohol/week: 0.0 standard drinks of alcohol   Drug use: No     Medication list has been reviewed and updated.  Current Meds  Medication Sig   Bioflavonoid Products (BIOFLEX PO) Take by mouth daily. For joint mobility   empagliflozin (JARDIANCE) 25 MG TABS tablet Take 25 mg by mouth daily.   Insulin Pen Needle (CARETOUCH PEN NEEDLES) 31G X 5 MM MISC 1 each by Does not apply route daily.   metFORMIN (GLUCOPHAGE) 500 MG tablet Take 1 tablet (500 mg total) by mouth 2 (two) times daily with a meal.   MOUNJARO 7.5 MG/0.5ML Pen SMARTSIG:7.5 Milligram(s) SUB-Q Once a Week   SEMGLEE, YFGN, 100 UNIT/ML Pen Inject into the skin.   [DISCONTINUED]  atorvastatin (LIPITOR) 10 MG tablet Take 1 tablet by mouth daily.   [DISCONTINUED] carvedilol (COREG) 12.5 MG tablet Take 1 tablet (12.5 mg total) by mouth 2 (two) times daily with a meal.   [DISCONTINUED] fluconazole (DIFLUCAN) 150 MG tablet Take 1 tablet (150 mg total) by mouth daily.   [DISCONTINUED] lisinopril-hydrochlorothiazide (ZESTORETIC) 20-12.5 MG tablet Take 1 tablet by mouth twice daily       12/25/2022    9:35 AM 01/01/2022    9:55 AM 07/15/2021   10:27 AM 01/01/2021    9:32 AM  GAD 7 : Generalized Anxiety Score  Nervous, Anxious, on Edge 0 0 0 0  Control/stop worrying 0 0 0 0  Worry too much - different things 0 0 0 0  Trouble relaxing 0 0 0 0  Restless 3 0 0 0  Easily annoyed or irritable 3 0 0 0  Afraid - awful might happen 0 0 0 0  Total GAD 7 Score 6 0 0 0  Anxiety Difficulty Not difficult at all Not difficult at all Not difficult at all        12/25/2022    9:34 AM 01/01/2022    9:55 AM 07/15/2021  10:27 AM  Depression screen PHQ 2/9  Decreased Interest 3 0 0  Down, Depressed, Hopeless 0 0 0  PHQ - 2 Score 3 0 0  Altered sleeping 0 0 0  Tired, decreased energy 0 0 0  Change in appetite 0 0 0  Feeling bad or failure about yourself  0 0 0  Trouble concentrating 0 0 0  Moving slowly or fidgety/restless 0 0 0  Suicidal thoughts 0 0 0  PHQ-9 Score 3 0 0  Difficult doing work/chores Not difficult at all Not difficult at all Not difficult at all    BP Readings from Last 3 Encounters:  12/25/22 120/76  06/19/22 112/62  01/01/22 110/60    Physical Exam Vitals and nursing note reviewed.  Constitutional:      General: She is not in acute distress.    Appearance: She is well-developed.  HENT:     Head: Normocephalic and atraumatic.     Right Ear: Tympanic membrane and ear canal normal.     Left Ear: Tympanic membrane and ear canal normal.     Nose:     Right Sinus: No maxillary sinus tenderness.     Left Sinus: No maxillary sinus tenderness.  Eyes:      General: No scleral icterus.       Right eye: No discharge.        Left eye: No discharge.     Conjunctiva/sclera: Conjunctivae normal.  Neck:     Thyroid: No thyromegaly.     Vascular: No carotid bruit.  Cardiovascular:     Rate and Rhythm: Normal rate and regular rhythm.     Pulses: Normal pulses.     Heart sounds: Normal heart sounds.  Pulmonary:     Effort: Pulmonary effort is normal. No respiratory distress.     Breath sounds: No wheezing.  Chest:  Breasts:    Right: No mass, nipple discharge, skin change or tenderness.     Left: No mass, nipple discharge, skin change or tenderness.     Comments: Post surgical changes in both breasts Abdominal:     General: Bowel sounds are normal.     Palpations: Abdomen is soft.     Tenderness: There is no abdominal tenderness.  Musculoskeletal:     Cervical back: Normal range of motion. No erythema.     Right lower leg: No edema.     Left lower leg: No edema.  Lymphadenopathy:     Cervical: No cervical adenopathy.  Skin:    General: Skin is warm and dry.     Findings: No rash.  Neurological:     Mental Status: She is alert and oriented to person, place, and time.     Cranial Nerves: No cranial nerve deficit.     Sensory: No sensory deficit.     Deep Tendon Reflexes: Reflexes are normal and symmetric.  Psychiatric:        Attention and Perception: Attention normal.        Mood and Affect: Mood normal.    Diabetic Foot Exam - Simple   Simple Foot Form Diabetic Foot exam was performed with the following findings: Yes 12/25/2022 10:01 AM  Visual Inspection No deformities, no ulcerations, no other skin breakdown bilaterally: Yes Sensation Testing Intact to touch and monofilament testing bilaterally: Yes Pulse Check Posterior Tibialis and Dorsalis pulse intact bilaterally: Yes Comments      Wt Readings from Last 3 Encounters:  12/25/22 217 lb (98.4 kg)  06/19/22 216 lb (  98 kg)  01/01/22 219 lb (99.3 kg)    BP 120/76    Pulse 73   Ht 5\' 7"  (1.702 m)   Wt 217 lb (98.4 kg)   SpO2 96%   BMI 33.99 kg/m   Assessment and Plan:  Problem List Items Addressed This Visit       Unprioritized   Type II diabetes mellitus with complication (HCC) (Chronic)    Blood sugars stable without hypoglycemic symptoms or events. Current regimen is Mounjaro, Insulin, metformin and Jardiance. Changes made last visit are none. Lab Results  Component Value Date   HGBA1C 6.8 03/17/2022  Followed by Endo - consider asking about higher dose Mounjaro A1C today = 6.9       Relevant Medications   lisinopril-hydrochlorothiazide (ZESTORETIC) 20-12.5 MG tablet   atorvastatin (LIPITOR) 10 MG tablet   Other Relevant Orders   POCT glycosylated hemoglobin (Hb A1C) (Completed)   Portal hypertension (HCC) (Chronic)    Followed by [facility] On Coreg and doing well      Relevant Medications   lisinopril-hydrochlorothiazide (ZESTORETIC) 20-12.5 MG tablet   carvedilol (COREG) 12.5 MG tablet   atorvastatin (LIPITOR) 10 MG tablet   Hyperlipidemia associated with type 2 diabetes mellitus (HCC) (Chronic)    LDL is  Lab Results  Component Value Date   LDLCALC 34 12/21/2022   Currently being treated with atorvastatin with good compliance and no concerns.       Relevant Medications   lisinopril-hydrochlorothiazide (ZESTORETIC) 20-12.5 MG tablet   carvedilol (COREG) 12.5 MG tablet   atorvastatin (LIPITOR) 10 MG tablet   Essential (primary) hypertension (Chronic)    Normal exam with stable BP on coreg, lisinopril, hctz. No concerns or side effects to current medication. No change in regimen; continue low sodium diet.       Relevant Medications   lisinopril-hydrochlorothiazide (ZESTORETIC) 20-12.5 MG tablet   carvedilol (COREG) 12.5 MG tablet   atorvastatin (LIPITOR) 10 MG tablet   Other Visit Diagnoses     Annual physical exam    -  Primary   Encounter for screening mammogram for breast cancer       last done by  [facility] patient will schedule   Colon cancer screening       Relevant Orders   Ambulatory referral to Gastroenterology   Yeast vaginitis       continue Diflucan once every 2-4 weeks   Relevant Medications   fluconazole (DIFLUCAN) 150 MG tablet   Long term current use of oral hypoglycemic drug           Return in about 6 months (around 06/27/2023) for HTN.    Reubin Milan, MD [facility] [place]

## 2022-12-25 NOTE — Assessment & Plan Note (Addendum)
Blood sugars stable without hypoglycemic symptoms or events. Current regimen is Mounjaro, Insulin, metformin and Jardiance. Changes made last visit are none. Lab Results  Component Value Date   HGBA1C 6.8 03/17/2022  Followed by Endo - consider asking about higher dose Mounjaro A1C today = 6.9

## 2022-12-25 NOTE — Assessment & Plan Note (Signed)
Normal exam with stable BP on coreg, lisinopril, hctz. No concerns or side effects to current medication. No change in regimen; continue low sodium diet.

## 2022-12-25 NOTE — Assessment & Plan Note (Signed)
Followed by [facility] On Coreg and doing well

## 2022-12-29 DIAGNOSIS — E782 Mixed hyperlipidemia: Secondary | ICD-10-CM | POA: Diagnosis not present

## 2022-12-29 DIAGNOSIS — E1129 Type 2 diabetes mellitus with other diabetic kidney complication: Secondary | ICD-10-CM | POA: Diagnosis not present

## 2022-12-29 DIAGNOSIS — I10 Essential (primary) hypertension: Secondary | ICD-10-CM | POA: Diagnosis not present

## 2022-12-29 DIAGNOSIS — K7581 Nonalcoholic steatohepatitis (NASH): Secondary | ICD-10-CM | POA: Diagnosis not present

## 2023-01-22 ENCOUNTER — Other Ambulatory Visit: Payer: Self-pay | Admitting: Internal Medicine

## 2023-01-22 DIAGNOSIS — E1169 Type 2 diabetes mellitus with other specified complication: Secondary | ICD-10-CM

## 2023-02-04 ENCOUNTER — Other Ambulatory Visit: Payer: Self-pay | Admitting: Internal Medicine

## 2023-02-04 DIAGNOSIS — I10 Essential (primary) hypertension: Secondary | ICD-10-CM

## 2023-02-04 DIAGNOSIS — K766 Portal hypertension: Secondary | ICD-10-CM

## 2023-02-05 NOTE — Telephone Encounter (Signed)
Unable to refill per protocol, request is too soon for refill. Last refill 12/25/22 for 90 and 1 refill.  Requested Prescriptions  Pending Prescriptions Disp Refills   carvedilol (COREG) 12.5 MG tablet [Pharmacy Med Name: CARVEDILOL 12.5MG  TABLETS] 180 tablet 1    Sig: TAKE 1 TABLET(12.5 MG) BY MOUTH TWICE DAILY WITH A MEAL     Cardiovascular: Beta Blockers 3 Failed - 02/04/2023  2:08 PM      Failed - AST in normal range and within 360 days    AST  Date Value Ref Range Status  12/21/2022 39 (A) 13 - 35 Final   SGOT(AST)  Date Value Ref Range Status  12/12/2013 43 (H) 15 - 37 Unit/L Final         Failed - ALT in normal range and within 360 days    ALT  Date Value Ref Range Status  12/21/2022 37 (A) 7 - 35 U/L Final   SGPT (ALT)  Date Value Ref Range Status  12/12/2013 37 U/L Final    Comment:    14-63 NOTE: New Reference Range 11/28/13          Passed - Cr in normal range and within 360 days    Creatinine  Date Value Ref Range Status  12/21/2022 1.0 0.5 - 1.1 Final  12/12/2013 1.04 0.60 - 1.30 mg/dL Final   Creatinine, Ser  Date Value Ref Range Status  01/01/2021 0.88 0.57 - 1.00 mg/dL Final   Creatinine, Urine  Date Value Ref Range Status  12/21/2022 116  Final         Passed - Last BP in normal range    BP Readings from Last 1 Encounters:  12/25/22 120/76         Passed - Last Heart Rate in normal range    Pulse Readings from Last 1 Encounters:  12/25/22 73         Passed - Valid encounter within last 6 months    Recent Outpatient Visits           1 month ago Annual physical exam   [place] [facility], Nyoka Cowden, MD   7 months ago Essential (primary) hypertension   [place] [facility] at [facility], Nyoka Cowden, MD   1 year ago Annual physical exam   [facility] at [facility], Nyoka Cowden, MD   1 year ago Essential (primary)  hypertension   [place] [facility] at [facility], Nyoka Cowden, MD   2 years ago Annual physical exam   [facility] Medicine at [facility], Nyoka Cowden, MD

## 2023-02-15 ENCOUNTER — Other Ambulatory Visit: Payer: Self-pay | Admitting: Internal Medicine

## 2023-02-15 DIAGNOSIS — I10 Essential (primary) hypertension: Secondary | ICD-10-CM

## 2023-03-23 DIAGNOSIS — R809 Proteinuria, unspecified: Secondary | ICD-10-CM | POA: Diagnosis not present

## 2023-03-23 DIAGNOSIS — Z794 Long term (current) use of insulin: Secondary | ICD-10-CM | POA: Diagnosis not present

## 2023-03-23 DIAGNOSIS — E1129 Type 2 diabetes mellitus with other diabetic kidney complication: Secondary | ICD-10-CM | POA: Diagnosis not present

## 2023-03-23 DIAGNOSIS — I10 Essential (primary) hypertension: Secondary | ICD-10-CM | POA: Diagnosis not present

## 2023-03-23 LAB — HEMOGLOBIN A1C: Hemoglobin A1C: 6.7

## 2023-04-28 DIAGNOSIS — I864 Gastric varices: Secondary | ICD-10-CM | POA: Diagnosis not present

## 2023-04-28 DIAGNOSIS — K766 Portal hypertension: Secondary | ICD-10-CM | POA: Diagnosis not present

## 2023-04-28 DIAGNOSIS — K7460 Unspecified cirrhosis of liver: Secondary | ICD-10-CM | POA: Diagnosis not present

## 2023-04-28 DIAGNOSIS — K7581 Nonalcoholic steatohepatitis (NASH): Secondary | ICD-10-CM | POA: Diagnosis not present

## 2023-06-15 DIAGNOSIS — R809 Proteinuria, unspecified: Secondary | ICD-10-CM | POA: Diagnosis not present

## 2023-06-15 DIAGNOSIS — E1129 Type 2 diabetes mellitus with other diabetic kidney complication: Secondary | ICD-10-CM | POA: Diagnosis not present

## 2023-06-15 DIAGNOSIS — Z794 Long term (current) use of insulin: Secondary | ICD-10-CM | POA: Diagnosis not present

## 2023-06-24 DIAGNOSIS — H524 Presbyopia: Secondary | ICD-10-CM | POA: Diagnosis not present

## 2023-06-24 DIAGNOSIS — E119 Type 2 diabetes mellitus without complications: Secondary | ICD-10-CM | POA: Diagnosis not present

## 2023-06-24 LAB — HM DIABETES EYE EXAM

## 2023-07-11 ENCOUNTER — Other Ambulatory Visit: Payer: Self-pay | Admitting: Internal Medicine

## 2023-07-11 DIAGNOSIS — E1169 Type 2 diabetes mellitus with other specified complication: Secondary | ICD-10-CM

## 2023-07-13 NOTE — Telephone Encounter (Signed)
 Requested Prescriptions  Pending Prescriptions Disp Refills   atorvastatin (LIPITOR) 10 MG tablet [Pharmacy Med Name: Atorvastatin Calcium 10mg  Tablet] 90 tablet 0    Sig: Take 1 tablet by mouth daily.     Cardiovascular:  Antilipid - Statins Failed - 07/13/2023  8:50 AM      Failed - Lipid Panel in normal range within the last 12 months    Cholesterol, Total  Date Value Ref Range Status  01/01/2021 112 100 - 199 mg/dL Final   Cholesterol  Date Value Ref Range Status  12/21/2022 104 0 - 200 Final   LDL Chol Calc (NIH)  Date Value Ref Range Status  01/01/2021 42 0 - 99 mg/dL Final   LDL Cholesterol  Date Value Ref Range Status  12/21/2022 34  Final   HDL  Date Value Ref Range Status  12/21/2022 38 35 - 70 Final  01/01/2021 53 >39 mg/dL Final   Triglycerides  Date Value Ref Range Status  12/21/2022 161 (A) 40 - 160 Final         Passed - Patient is not pregnant      Passed - Valid encounter within last 12 months    Recent Outpatient Visits           6 months ago Annual physical exam   [place] [facility] at [facility], Nyoka Cowden, MD   1 year ago Essential (primary) hypertension   [place] [facility] at [facility], Nyoka Cowden, MD   1 year ago Annual physical exam   [facility] at [facility], Nyoka Cowden, MD   1 year ago Essential (primary) hypertension   [place] [facility] at [facility], Nyoka Cowden, MD   2 years ago Annual physical exam   [facility] Medicine at [facility], Nyoka Cowden, MD

## 2023-08-20 ENCOUNTER — Other Ambulatory Visit: Payer: Self-pay | Admitting: Internal Medicine

## 2023-08-20 DIAGNOSIS — I10 Essential (primary) hypertension: Secondary | ICD-10-CM

## 2023-08-20 DIAGNOSIS — K766 Portal hypertension: Secondary | ICD-10-CM

## 2023-08-20 NOTE — Telephone Encounter (Signed)
 Needs appointment

## 2023-08-20 NOTE — Telephone Encounter (Signed)
 CPE needed for additional refills.  Requested Prescriptions  Pending Prescriptions Disp Refills   carvedilol (COREG) 12.5 MG tablet [Pharmacy Med Name: CARVEDILOL 12.5MG  TABLETS] 180 tablet 0    Sig: TAKE 1 TABLET(12.5 MG) BY MOUTH TWICE DAILY WITH A MEAL     Cardiovascular: Beta Blockers 3 Failed - 08/20/2023  3:46 PM      Failed - AST in normal range and within 360 days    AST  Date Value Ref Range Status  12/21/2022 39 (A) 13 - 35 Final   SGOT(AST)  Date Value Ref Range Status  12/12/2013 43 (H) 15 - 37 Unit/L Final         Failed - ALT in normal range and within 360 days    ALT  Date Value Ref Range Status  12/21/2022 37 (A) 7 - 35 U/L Final   SGPT (ALT)  Date Value Ref Range Status  12/12/2013 37 U/L Final    Comment:    14-63 NOTE: New Reference Range 11/28/13          Failed - Valid encounter within last 6 months    Recent Outpatient Visits   None            Passed - Cr in normal range and within 360 days    Creatinine  Date Value Ref Range Status  12/21/2022 1.0 0.5 - 1.1 Final  12/12/2013 1.04 0.60 - 1.30 mg/dL Final   Creatinine, Ser  Date Value Ref Range Status  01/01/2021 0.88 0.57 - 1.00 mg/dL Final   Creatinine, Urine  Date Value Ref Range Status  12/21/2022 116  Final         Passed - Last BP in normal range    BP Readings from Last 1 Encounters:  12/25/22 120/76         Passed - Last Heart Rate in normal range    Pulse Readings from Last 1 Encounters:  12/25/22 73

## 2023-09-03 ENCOUNTER — Ambulatory Visit (INDEPENDENT_AMBULATORY_CARE_PROVIDER_SITE_OTHER): Admitting: Internal Medicine

## 2023-09-03 ENCOUNTER — Encounter: Payer: Self-pay | Admitting: Internal Medicine

## 2023-09-03 VITALS — BP 118/74 | HR 63 | Ht 67.0 in | Wt 217.2 lb

## 2023-09-03 DIAGNOSIS — L308 Other specified dermatitis: Secondary | ICD-10-CM | POA: Diagnosis not present

## 2023-09-03 DIAGNOSIS — L723 Sebaceous cyst: Secondary | ICD-10-CM | POA: Diagnosis not present

## 2023-09-03 DIAGNOSIS — R718 Other abnormality of red blood cells: Secondary | ICD-10-CM | POA: Diagnosis not present

## 2023-09-03 DIAGNOSIS — E118 Type 2 diabetes mellitus with unspecified complications: Secondary | ICD-10-CM

## 2023-09-03 DIAGNOSIS — Z7984 Long term (current) use of oral hypoglycemic drugs: Secondary | ICD-10-CM

## 2023-09-03 DIAGNOSIS — I10 Essential (primary) hypertension: Secondary | ICD-10-CM

## 2023-09-03 DIAGNOSIS — L309 Dermatitis, unspecified: Secondary | ICD-10-CM | POA: Insufficient documentation

## 2023-09-03 MED ORDER — LISINOPRIL-HYDROCHLOROTHIAZIDE 20-12.5 MG PO TABS: 1.0000 | ORAL_TABLET | Freq: Two times a day (BID) | ORAL | 1 refills | Status: DC

## 2023-09-03 MED ORDER — FLUCONAZOLE 100 MG PO TABS: 100.0000 mg | ORAL_TABLET | Freq: Every day | ORAL | 0 refills | Status: AC

## 2023-09-03 NOTE — Patient Instructions (Signed)
 Over the counter cortisone topically twice a day to eczema lesions

## 2023-09-03 NOTE — Progress Notes (Signed)
 Date:  09/03/2023   Name:  Meagan Ibarra   DOB:  12/22/1960   MRN:  [id number]   Chief Complaint: Medication Refill and Cyst (Patient said she has a lump on her right shoulder, tender, redness, has had it for a couple of months, noticed it got bigger 3 weeks ago)  Hypertension This is a chronic problem. The problem is controlled. Pertinent negatives include no shortness of breath. Past treatments include ACE inhibitors, beta blockers and diuretics. There are no compliance problems.   Anemia Presents for initial visit. Symptoms include pica. There has been no bruising/bleeding easily, fever or light-headedness. Signs of blood loss that are not present include hematochezia and melena.  Cyst - on right shoulder, enlarging and mildly tender from bra strap.   Review of Systems  Constitutional:  Negative for chills, fatigue, fever and unexpected weight change.  Respiratory:  Negative for chest tightness and shortness of breath.   Gastrointestinal:  Negative for hematochezia and melena.  Neurological:  Negative for dizziness and light-headedness.  Hematological:  Does not bruise/bleed easily.     Lab Results  Component Value Date   NA 139 12/21/2022   K 3.5 12/21/2022   CO2 28 (A) 12/21/2022   GLUCOSE 186 (H) 01/01/2021   BUN 30 (A) 12/21/2022   CREATININE 1.0 12/21/2022   CALCIUM  9.5 01/01/2021   EGFR 64 12/21/2022   GFRNONAA 62 08/14/2019   Lab Results  Component Value Date   CHOL 104 12/21/2022   HDL 38 12/21/2022   LDLCALC 34 12/21/2022   TRIG 161 (A) 12/21/2022   CHOLHDL 2.1 01/01/2021   Lab Results  Component Value Date   TSH 1.760 01/01/2021   Lab Results  Component Value Date   HGBA1C 6.7 03/23/2023   Lab Results  Component Value Date   WBC 4.5 12/21/2022   HGB 12.4 12/21/2022   HCT 40 12/21/2022   MCV 78 (L) 01/01/2021   PLT 69 (A) 12/21/2022   Lab Results  Component Value Date   ALT 37 (A) 12/21/2022   AST 39 (A) 12/21/2022   ALKPHOS 122 (H)  01/01/2021   BILITOT 0.9 01/01/2021   No results found for: "25OHVITD2", "25OHVITD3", "VD25OH"   Patient Active Problem List   Diagnosis Date Noted   Eczema 09/03/2023   Portal hypertension (HCC) 06/25/2019   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/10/2019   Type II diabetes mellitus with complication (HCC) 02/24/2018   Chronic right shoulder pain 03/03/2017   Adenomatous colon polyp 04/17/2016   FH: CAD (coronary artery disease) 04/17/2016   Essential (primary) hypertension 11/07/2014   Hyperlipidemia associated with type 2 diabetes mellitus (HCC) 11/07/2014   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/22/2014   Fibroid 10/03/2013   Cyst of right ovary 10/03/2013    Allergies  Allergen Reactions   Ozempic  [Semaglutide ] Nausea And Vomiting   Invokana  [Canagliflozin ] Itching    Past Surgical History:  Procedure Laterality Date   BREAST REDUCTION SURGERY  1980   BREAST SURGERY     COLONOSCOPY WITH PROPOFOL  N/A 12/31/2014   19 polyps found   COLONOSCOPY WITH PROPOFOL  N/A 06/05/2016   Procedure: COLONOSCOPY WITH PROPOFOL ;  Surgeon: Deveron Fly, MD;  Location: [facility] ENDOSCOPY;  Service: Endoscopy;  Laterality: N/A;    Social History   Tobacco Use   Smoking status: Former   Smokeless tobacco: Never  Vaping Use   Vaping status: Never Used  Substance Use Topics   Alcohol use: No  Alcohol/week: 0.0 standard drinks of alcohol   Drug use: No     Medication list has been reviewed and updated.  Current Meds  Medication Sig   atorvastatin  (LIPITOR) 10 MG tablet Take 1 tablet by mouth daily.   Bioflavonoid Products (BIOFLEX PO) Take by mouth daily. For joint mobility   carvedilol  (COREG ) 12.5 MG tablet TAKE 1 TABLET(12.5 MG) BY MOUTH TWICE DAILY WITH A MEAL   empagliflozin (JARDIANCE) 25 MG TABS tablet Take 25 mg by mouth daily.   fluconazole  (DIFLUCAN ) 100 MG tablet Take 1 tablet (100 mg total) by mouth daily for 7 days.   Insulin  Pen Needle (CARETOUCH PEN  NEEDLES) 31G X 5 MM MISC 1 each by Does not apply route daily.   metFORMIN  (GLUCOPHAGE ) 500 MG tablet Take 1 tablet (500 mg total) by mouth 2 (two) times daily with a meal.   SEMGLEE , YFGN, 100 UNIT/ML Pen Inject into the skin.   [DISCONTINUED] lisinopril -hydrochlorothiazide  (ZESTORETIC ) 20-12.5 MG tablet TAKE 1 TABLET BY MOUTH TWICE DAILY       12/25/2022    9:35 AM 01/01/2022    9:55 AM 07/15/2021   10:27 AM 01/01/2021    9:32 AM  GAD 7 : Generalized Anxiety Score  Nervous, Anxious, on Edge 0 0 0 0  Control/stop worrying 0 0 0 0  Worry too much - different things 0 0 0 0  Trouble relaxing 0 0 0 0  Restless 3 0 0 0  Easily annoyed or irritable 3 0 0 0  Afraid - awful might happen 0 0 0 0  Total GAD 7 Score 6 0 0 0  Anxiety Difficulty Not difficult at all Not difficult at all Not difficult at all        12/25/2022    9:34 AM 01/01/2022    9:55 AM 07/15/2021   10:27 AM  Depression screen PHQ 2/9  Decreased Interest 3 0 0  Down, Depressed, Hopeless 0 0 0  PHQ - 2 Score 3 0 0  Altered sleeping 0 0 0  Tired, decreased energy 0 0 0  Change in appetite 0 0 0  Feeling bad or failure about yourself  0 0 0  Trouble concentrating 0 0 0  Moving slowly or fidgety/restless 0 0 0  Suicidal thoughts 0 0 0  PHQ-9 Score 3 0 0  Difficult doing work/chores Not difficult at all Not difficult at all Not difficult at all    BP Readings from Last 3 Encounters:  09/03/23 118/74  12/25/22 120/76  06/19/22 112/62    Physical Exam Vitals and nursing note reviewed.  Constitutional:      General: She is not in acute distress.    Appearance: Normal appearance. She is well-developed.  HENT:     Head: Normocephalic and atraumatic.  Cardiovascular:     Rate and Rhythm: Normal rate and regular rhythm.  Pulmonary:     Effort: Pulmonary effort is normal. No respiratory distress.     Breath sounds: No wheezing or rhonchi.  Musculoskeletal:     Cervical back: Normal range of motion.     Right lower  leg: No edema.     Left lower leg: No edema.  Lymphadenopathy:     Cervical: No cervical adenopathy.  Skin:    General: Skin is warm and dry.     Findings: Lesion present. No rash.     Comments: 1 cm smooth soft sebaceous cyst - mildly tender  Scattered ~1 cm eczematous lesions on both thighs  Neurological:     Mental Status: She is alert and oriented to person, place, and time.  Psychiatric:        Mood and Affect: Mood normal.        Behavior: Behavior normal.    Diabetic Foot Exam - Simple   Simple Foot Form Diabetic Foot exam was performed with the following findings: Yes 09/03/2023  3:48 PM  Visual Inspection No deformities, no ulcerations, no other skin breakdown bilaterally: Yes Sensation Testing Intact to touch and monofilament testing bilaterally: Yes Pulse Check Posterior Tibialis and Dorsalis pulse intact bilaterally: Yes Comments      Wt Readings from Last 3 Encounters:  09/03/23 217 lb 4 oz (98.5 kg)  12/25/22 217 lb (98.4 kg)  06/19/22 216 lb (98 kg)    BP 118/74   Pulse 63   Ht 5\' 7"  (1.702 m)   Wt 217 lb 4 oz (98.5 kg)   SpO2 94%   BMI 34.03 kg/m   Assessment and Plan:  Problem List Items Addressed This Visit       Unprioritized   Essential (primary) hypertension (Chronic)   Blood pressure is well controlled.  Current medications lisinopril  hct and Coreg .  Taking Coreg  12.5 mg AM and 6.25 mg PM Will continue same regimen along with efforts to limit dietary sodium.       Relevant Medications   lisinopril -hydrochlorothiazide  (ZESTORETIC ) 20-12.5 MG tablet   Type II diabetes mellitus with complication (HCC) (Chronic)   Blood sugars have been stable.  No recent hypoglycemic events requiring assistance. Currently medications are Jardiance, MTF and Insulin .  Stopped Mounjaro due to intestinal side effects Lab Results  Component Value Date   HGBA1C 6.7 03/23/2023   Last visit Mounjaro was stopped.  Rybelsus  ordered but she elected not to start  it yet.       Relevant Medications   lisinopril -hydrochlorothiazide  (ZESTORETIC ) 20-12.5 MG tablet   Eczema   Recommend otc steroid cream bid as needed.      Other Visit Diagnoses       Microcytosis    -  Primary   recent new low Hgb in December with new pica sx will check CBC and iron   Relevant Orders   CBC with Differential/Platelet   Iron, TIBC and Ferritin Panel     Sebaceous cyst       Relevant Orders   Ambulatory referral to General Surgery     Long term current use of oral hypoglycemic drug           Return in about 4 months (around 01/03/2024) for CPX.    Sheron Dixons, MD [facility] [place]

## 2023-09-03 NOTE — Assessment & Plan Note (Signed)
 Recommend otc steroid cream bid as needed.

## 2023-09-03 NOTE — Assessment & Plan Note (Signed)
 Blood pressure is well controlled.  Current medications lisinopril  hct and Coreg .  Taking Coreg  12.5 mg AM and 6.25 mg PM Will continue same regimen along with efforts to limit dietary sodium.

## 2023-09-03 NOTE — Assessment & Plan Note (Signed)
 Blood sugars have been stable.  No recent hypoglycemic events requiring assistance. Currently medications are Jardiance, MTF and Insulin .  Stopped Mounjaro due to intestinal side effects Lab Results  Component Value Date   HGBA1C 6.7 03/23/2023   Last visit Mounjaro was stopped.  Rybelsus  ordered but she elected not to start it yet.

## 2023-09-04 LAB — IRON,TIBC AND FERRITIN PANEL
Ferritin: 16 ng/mL (ref 15–150)
Iron Saturation: 11 % — ABNORMAL LOW (ref 15–55)
Iron: 54 ug/dL (ref 27–159)
Total Iron Binding Capacity: 481 ug/dL — ABNORMAL HIGH (ref 250–450)
UIBC: 427 ug/dL — ABNORMAL HIGH (ref 131–425)

## 2023-09-04 LAB — CBC WITH DIFFERENTIAL/PLATELET
Basophils Absolute: 0 10*3/uL (ref 0.0–0.2)
Basos: 1 %
EOS (ABSOLUTE): 0.1 10*3/uL (ref 0.0–0.4)
Eos: 2 %
Hematocrit: 40.2 % (ref 34.0–46.6)
Hemoglobin: 12.4 g/dL (ref 11.1–15.9)
Immature Grans (Abs): 0 10*3/uL (ref 0.0–0.1)
Immature Granulocytes: 1 %
Lymphocytes Absolute: 1 10*3/uL (ref 0.7–3.1)
Lymphs: 20 %
MCH: 23.8 pg — ABNORMAL LOW (ref 26.6–33.0)
MCHC: 30.8 g/dL — ABNORMAL LOW (ref 31.5–35.7)
MCV: 77 fL — ABNORMAL LOW (ref 79–97)
Monocytes Absolute: 0.5 10*3/uL (ref 0.1–0.9)
Monocytes: 9 %
Neutrophils Absolute: 3.3 10*3/uL (ref 1.4–7.0)
Neutrophils: 67 %
Platelets: 77 10*3/uL — CL (ref 150–450)
RBC: 5.2 x10E6/uL (ref 3.77–5.28)
RDW: 21.8 % — ABNORMAL HIGH (ref 11.7–15.4)
WBC: 4.9 10*3/uL (ref 3.4–10.8)

## 2023-09-05 ENCOUNTER — Encounter: Payer: Self-pay | Admitting: Internal Medicine

## 2023-09-09 DIAGNOSIS — R809 Proteinuria, unspecified: Secondary | ICD-10-CM | POA: Diagnosis not present

## 2023-09-09 DIAGNOSIS — Z794 Long term (current) use of insulin: Secondary | ICD-10-CM | POA: Diagnosis not present

## 2023-09-09 DIAGNOSIS — E1129 Type 2 diabetes mellitus with other diabetic kidney complication: Secondary | ICD-10-CM | POA: Diagnosis not present

## 2023-09-09 LAB — MICROALBUMIN / CREATININE URINE RATIO: Microalb Creat Ratio: 9

## 2023-09-09 LAB — PROTEIN / CREATININE RATIO, URINE
Albumin, U: 7
Creatinine, Urine: 75

## 2023-09-14 DIAGNOSIS — E1129 Type 2 diabetes mellitus with other diabetic kidney complication: Secondary | ICD-10-CM | POA: Diagnosis not present

## 2023-09-14 DIAGNOSIS — I10 Essential (primary) hypertension: Secondary | ICD-10-CM | POA: Diagnosis not present

## 2023-09-14 DIAGNOSIS — R809 Proteinuria, unspecified: Secondary | ICD-10-CM | POA: Diagnosis not present

## 2023-09-14 DIAGNOSIS — E782 Mixed hyperlipidemia: Secondary | ICD-10-CM | POA: Diagnosis not present

## 2023-09-15 ENCOUNTER — Ambulatory Visit: Admitting: Surgery

## 2023-09-20 ENCOUNTER — Ambulatory Visit (INDEPENDENT_AMBULATORY_CARE_PROVIDER_SITE_OTHER): Admitting: Surgery

## 2023-09-20 ENCOUNTER — Encounter: Payer: Self-pay | Admitting: Surgery

## 2023-09-20 VITALS — BP 110/68 | HR 61 | Ht 67.0 in | Wt 217.0 lb

## 2023-09-20 DIAGNOSIS — L720 Epidermal cyst: Secondary | ICD-10-CM | POA: Diagnosis not present

## 2023-09-20 NOTE — Patient Instructions (Signed)
 We will schedule you for an excision of this are in our office. You may drive but may have someone with you if you like.   Pocket of Fluid in the Skin (Epidermoid Cyst): What to Know  An epidermoid cyst is a small lump under your skin. The cyst contains a substance that is thick and oily. What are the causes? A blocked hair follicle. A hair curls and re-enters the skin instead of growing straight out of the skin. A blocked pore. Irritated skin. An injury to the skin. Certain conditions that are passed from parent to child. Human papillomavirus (HPV). This happens rarely when cysts occur on the bottom of the feet. Long-term sun damage to the skin. What increases the risk? Having acne. Being female. Having an injury to the skin. Being past puberty. Certain conditions that are passed down through family (genetic disorder). What are the signs or symptoms? The only sign of this type of cyst may be a small, painless lump under the skin. These cysts are usually painless, but they can get infected. Symptoms of infection may include: Redness. Inflammation. Tenderness. Warmth. Fever. A bad-smelling substance that drains from the cyst. Pus that drains from the cyst. How is this treated? If a cyst becomes inflamed, treatment may include: Opening and draining the cyst. Antibiotics. Shots of medicines called steroids that help lessen inflammation. Surgery to remove the cyst if it's large, painful, or could turn into cancer. Do not try to open or squeeze a cyst yourself. Follow these instructions at home: Medicines Take your medicines only as told. If you were given antibiotics, take them as told. Do not stop taking them even if you start to feel better. General instructions Keep the area around your cyst clean and dry. Wear loose, dry clothing. Avoid touching your cyst. Check your cyst every day for signs of infection. Check for: Redness, swelling, or pain. Fluid or blood. Warmth. Pus  or a bad smell. Keep all follow-up visits to make sure there's no discomfort or infection. Contact a doctor if: You have any signs of infection. Your cyst doesn't get better or gets worse. You get a cyst that looks different from other cysts you've had. You have a fever. You have redness that spreads from the cyst. This information is not intended to replace advice given to you by your health care provider. Make sure you discuss any questions you have with your health care provider. Document Revised: 12/11/2022 Document Reviewed: 12/11/2022 Elsevier Patient Education  2024 [employer].

## 2023-09-22 NOTE — Progress Notes (Signed)
 Patient ID: Meagan Ibarra, female   DOB: Mar 01, 1961, 63 y.o.   MRN: [id number]  HPI Meagan Ibarra is a 63 y.o. female seen in consultation at the request of Dr. Gala Jubilee. SHe endorses a cystic lesion on right shoulder for several months.  He states that there is some discomfort and has enlarged and had some erythema.  No fevers no chills.  She thinks has increased in size .  She is diabetic and her last hemoglobin A1c is 7.3, CBC shows platelets of 77,000 although no evidence of easy bruising or active bleeding.  Rest of the CBC is normal, INR is normal No issues with local anesthetic SHe works [employer], [employer]   HPI  Past Medical History:  Diagnosis Date   Diabetes mellitus without complication (HCC)    Hypertension    NASH (nonalcoholic steatohepatitis)    NASH (nonalcoholic steatohepatitis)    Tubular adenoma of colon     Past Surgical History:  Procedure Laterality Date   BREAST REDUCTION SURGERY  1980   BREAST SURGERY     COLONOSCOPY WITH PROPOFOL  N/A 12/31/2014   19 polyps found   COLONOSCOPY WITH PROPOFOL  N/A 06/05/2016   Procedure: COLONOSCOPY WITH PROPOFOL ;  Surgeon: Deveron Fly, MD;  Location: [facility] ENDOSCOPY;  Service: Endoscopy;  Laterality: N/A;    Family History  Problem Relation Age of Onset   Hypertension Mother    CAD Mother 37   Hypertension Father    Diabetes Father    CAD Sister 3   Atrial fibrillation Sister     Social History Social History   Tobacco Use   Smoking status: Former    Passive exposure: Past   Smokeless tobacco: Never  Vaping Use   Vaping status: Never Used  Substance Use Topics   Alcohol use: No    Alcohol/week: 0.0 standard drinks of alcohol   Drug use: No    Allergies  Allergen Reactions   Ozempic  [Semaglutide ] Nausea And Vomiting   Invokana  [Canagliflozin ] Itching    Current Outpatient Medications  Medication Sig Dispense Refill   atorvastatin  (LIPITOR) 10 MG tablet Take 1 tablet by mouth daily. 90 tablet 0    Bioflavonoid Products (BIOFLEX PO) Take by mouth daily. For joint mobility     carvedilol  (COREG ) 12.5 MG tablet TAKE 1 TABLET(12.5 MG) BY MOUTH TWICE DAILY WITH A MEAL 180 tablet 0   empagliflozin (JARDIANCE) 25 MG TABS tablet Take 25 mg by mouth daily.     Insulin  Pen Needle (CARETOUCH PEN NEEDLES) 31G X 5 MM MISC 1 each by Does not apply route daily. 100 each 0   lisinopril -hydrochlorothiazide  (ZESTORETIC ) 20-12.5 MG tablet Take 1 tablet by mouth 2 (two) times daily. 180 tablet 1   metFORMIN  (GLUCOPHAGE ) 500 MG tablet Take 1 tablet (500 mg total) by mouth 2 (two) times daily with a meal. 60 tablet 0   Semaglutide  3 MG TABS Take 3 mg by mouth.     SEMGLEE , YFGN, 100 UNIT/ML Pen Inject into the skin.     No current facility-administered medications for this visit.     Review of Systems Full ROS  was asked and was negative except for the information on the HPI  Physical Exam Blood pressure 110/68, pulse 61, height 5\' 7"  (1.702 m), weight 217 lb (98.4 kg), SpO2 96%. CONSTITUTIONAL: NAD. EYES: Pupils are equal, round, and reactive to light, Sclera are non-icteric. RESPIRATORY:  Lungs are clear. There is normal respiratory effort, with equal breath sounds bilaterally, and without pathologic  use of accessory muscles. CARDIOVASCULAR: Heart is regular without murmurs, gallops, or rubs. GI: The abdomen is  soft, nontender, and nondistended. There are no palpable masses. There is no hepatosplenomegaly. There are normal bowel sounds in all quadrants. GU: Rectal deferred.   MUSCULOSKELETAL: Normal muscle strength and tone. No cyanosis or edema.   SKIN: Turgor is good and there are no pathologic skin lesions or ulcers. Subcutaneous nodule measuring 3 x 2 cm on right shoulder c/w EIC, not coming from joint. NEUROLOGIC: Motor and sensation is grossly normal. Cranial nerves are grossly intact. PSYCH:  Oriented to person, place and time. Affect is normal.  Data Reviewed  I have personally reviewed  the patient's imaging, laboratory findings and medical records.    Assessment/Plan  Symptomatic right shoulder epidermal inclusion cyst.  Discussed with the patient in detail about her disease process.  Given that is symptomatic and enlarging I do think that is prudent to go ahead and excise it.  I do think that we can do it under local anesthetic.  Procedure discussed with the patient in detail.  Risk, benefits and possible medications including but not limited to: Bleeding, infection recurrence, chronic pain.  She wishes to proceed.  I do think that is reasonable to do it here under local anesthetic.  Please note that I spent 30 minutes in this encounter including personally reviewing records, coordinating her care, placing orders and performing documentation.  A copy of this report was sent to the referring provider  Evelia Hipp, MD FACS General Surgeon 09/22/2023, 11:37 AM

## 2023-10-06 ENCOUNTER — Encounter: Payer: Self-pay | Admitting: Surgery

## 2023-10-06 ENCOUNTER — Ambulatory Visit (INDEPENDENT_AMBULATORY_CARE_PROVIDER_SITE_OTHER): Admitting: Surgery

## 2023-10-06 VITALS — BP 123/70 | HR 55 | Ht 67.0 in | Wt 214.0 lb

## 2023-10-06 DIAGNOSIS — L720 Epidermal cyst: Secondary | ICD-10-CM

## 2023-10-06 DIAGNOSIS — L080 Pyoderma: Secondary | ICD-10-CM | POA: Diagnosis not present

## 2023-10-06 DIAGNOSIS — L308 Other specified dermatitis: Secondary | ICD-10-CM | POA: Diagnosis not present

## 2023-10-06 NOTE — Patient Instructions (Addendum)
 We have removed a Cyst in our office today.  You have sutures under the skin that will dissolve and also dermabond (skin glue) on top of your skin which will come off on it's own in 10-14 days. Your incision was closed with Dermabond.  It is best to keep it clean and dry, it will tolerate a brief shower, but do not soak it or apply any creams or lotions to the incisions.  The Dermabond should gradually flake off over time.  Keep it open to air so you can evaluate your incisions.  Dermabond assists the underlying sutures to keep your incision closed and protected from infection.  Should you develop some drainage from your incision, some drops of drainage would be okay but if it persists continue to put keep a dry dressing over it.   You may use Ibuprofen or Tylenol as needed for pain control. Use the ice pack 3-4 times a day for the next two days for any achiness.  You may shower Friday. Do not scrub at the area.   Avoid Strenuous activities that will make you sweat during the next 48 hours to avoid the glue coming off prematurely. Avoid activities that will place pressure to this area of the body for 1-2 weeks to avoid re-injury to incision site.   We will call you to review the final pathology. If you have any questions or concerns prior to this appointment, call our office and speak with a nurse.    Excision of Skin Cysts or Lesions Excision of a skin lesion refers to the removal of a section of skin by making small cuts (incisions) in the skin. This procedure may be done to remove a cancerous (malignant) or noncancerous (benign) growth on the skin. It is typically done to treat or prevent cancer or infection. It may also be done to improve cosmetic appearance. The procedure may be done to remove: Cancerous growths, such as basal cell carcinoma, squamous cell carcinoma, or melanoma. Noncancerous growths, such as a cyst or lipoma. Growths, such as moles or skin tags, which may be removed for  cosmetic reasons.  Various excision or surgical techniques may be used depending on your condition, the location of the lesion, and your overall health. Tell a health care provider about: Any allergies you have. All medicines you are taking, including vitamins, herbs, eye drops, creams, and over-the-counter medicines. Any problems you or family members have had with anesthetic medicines. Any blood disorders you have. Any surgeries you have had. Any medical conditions you have. Whether you are pregnant or may be pregnant. What are the risks? Generally, this is a safe procedure. However, problems may occur, including: Bleeding. Infection. Scarring. Recurrence of the cyst, lipoma, or cancer. Changes in skin sensation or appearance, such as discoloration or swelling. Reaction to the anesthetics. Allergic reaction to surgical materials or ointments. Damage to nerves, blood vessels, muscles, or other structures. Continued pain.  What happens before the procedure? Ask your health care provider about: Changing or stopping your regular medicines. This is especially important if you are taking diabetes medicines or blood thinners. Taking medicines such as aspirin and ibuprofen. These medicines can thin your blood. Do not take these medicines before your procedure if your health care provider instructs you not to. You may be asked to take certain medicines. You may be asked to stop smoking. You may have an exam or testing. What happens during the procedure? To reduce your risk of infection: Your health care team will  wash or sanitize their hands. Your skin will be washed with soap. You will be given a medicine to numb the area (local anesthetic). One of the following excision techniques will be performed. At the end of any of these procedures, antibiotic ointment will be applied as needed. Each of the following techniques may vary among health care providers and hospitals. Complete  Surgical Excision The area of skin that needs to be removed will be marked with a pen. Using a small scalpel or scissors, the surgeon will gently cut around and under the lesion until it is completely removed. The lesion will be placed in a fluid and sent to the lab for examination. If necessary, bleeding will be controlled with a device that delivers heat (electrocautery). The edges of the wound may be stitched (sutured) together, and a bandage (dressing) or surgical glue will be applied. This procedure may be performed to treat a cancerous growth or a noncancerous cyst or lesion.  What happens after the procedure? Return to your normal activities as told by your health care provider. Report any excessive bleeding, spreading redness, or increased pain.

## 2023-10-07 NOTE — Progress Notes (Signed)
 DIAGNOSIS Symptomatic soft tissue mass Right shoulder   PROCEDURES 1.  Excision of soft tissue mass Right shoulder   ANESTHESIA: Lidocaine  1% with epinephrine  EBL: Minimal  FINDINGS: subcutaneous cyst 3 cm  After informed consent was obtained the patient was prepped and draped in the usual sterile fashion.  Lidocaine  1% was injected over the area of interest.  15 blade knife used to create an incision and the subcutaneous tissue was dissected free with hemostats.  The mass was dissected free from adjacent structures using Metzenbaum scissors.  It was sent for permanent pathology.  Hemostasis obtained with pressure.  The wound was closed in a 2 layer fashion with dermal layer using interrupted 3-0 Vicryl.  The skin was closed in a subcuticular fashion using 4-0 Monocryl.  Dermabond was applied.  No complications. The  patient tolerated procedure well

## 2023-10-12 ENCOUNTER — Ambulatory Visit: Payer: Self-pay

## 2023-10-12 LAB — SURGICAL PATHOLOGY

## 2023-10-12 NOTE — Telephone Encounter (Signed)
-----   Message from [facility] sent at 10/12/2023  3:34 PM EDT ----- Please let her know pathology was consistent w ruptured cyst, no cancer, nothing to be concerned about ----- Message ----- From: Interface, Lab In Three Zero One Sent: 10/12/2023   3:25 PM EDT To: Alben Alma, MD

## 2023-10-12 NOTE — Telephone Encounter (Signed)
 Left message for the patient letting her know that her pathology was consistent with a ruptured cyst. She may call with any questions.

## 2023-10-20 DIAGNOSIS — K7460 Unspecified cirrhosis of liver: Secondary | ICD-10-CM | POA: Diagnosis not present

## 2023-10-20 DIAGNOSIS — R188 Other ascites: Secondary | ICD-10-CM | POA: Diagnosis not present

## 2023-10-20 LAB — COMPREHENSIVE METABOLIC PANEL WITH GFR
Calcium: 9.6 (ref 8.7–10.7)
eGFR: 64

## 2023-10-20 LAB — HEPATIC FUNCTION PANEL
ALT: 38 U/L — AB (ref 7–35)
AST: 39 — AB (ref 13–35)
Alkaline Phosphatase: 65 (ref 25–125)

## 2023-10-20 LAB — CBC AND DIFFERENTIAL
HCT: 38 (ref 36–46)
Hemoglobin: 12.3 (ref 12.0–16.0)
Platelets: 69 K/uL — AB (ref 150–400)
WBC: 5.3

## 2023-10-20 LAB — BASIC METABOLIC PANEL WITH GFR
BUN: 20 (ref 4–21)
CO2: 24 — AB (ref 13–22)
Chloride: 104 (ref 99–108)
Creatinine: 1 (ref 0.5–1.1)
Glucose: 155
Potassium: 3.5 meq/L (ref 3.5–5.1)
Sodium: 141 (ref 137–147)

## 2023-11-11 DIAGNOSIS — R238 Other skin changes: Secondary | ICD-10-CM | POA: Diagnosis not present

## 2023-11-11 DIAGNOSIS — R208 Other disturbances of skin sensation: Secondary | ICD-10-CM | POA: Diagnosis not present

## 2023-11-11 DIAGNOSIS — L300 Nummular dermatitis: Secondary | ICD-10-CM | POA: Diagnosis not present

## 2023-11-11 DIAGNOSIS — L853 Xerosis cutis: Secondary | ICD-10-CM | POA: Diagnosis not present

## 2023-11-11 DIAGNOSIS — L820 Inflamed seborrheic keratosis: Secondary | ICD-10-CM | POA: Diagnosis not present

## 2023-11-11 DIAGNOSIS — L538 Other specified erythematous conditions: Secondary | ICD-10-CM | POA: Diagnosis not present

## 2023-12-07 ENCOUNTER — Other Ambulatory Visit: Payer: Self-pay | Admitting: Internal Medicine

## 2023-12-07 DIAGNOSIS — E1169 Type 2 diabetes mellitus with other specified complication: Secondary | ICD-10-CM

## 2023-12-07 NOTE — Telephone Encounter (Unsigned)
 Copied from CRM [id number]. Topic: Clinical - Medication Refill >> Dec 07, 2023 11:40 AM Carlatta H wrote: Medication: atorvastatin  (LIPITOR) 10 MG tablet  Has the patient contacted their pharmacy? No (Agent: If no, request that the patient contact the pharmacy for the refill. If patient does not wish to contact the pharmacy document the reason why and proceed with request.) (Agent: If yes, when and what did the pharmacy advise?)  This is the patient's preferred pharmacy:   [id number], [place] - [address] AT [facility] [address] [place] [address] Phone: [phone] Fax: [phone]    Is this the correct pharmacy for this prescription? Yes If no, delete pharmacy and type the correct one.   Has the prescription been filled recently? No  Is the patient out of the medication? Yes  Has the patient been seen for an appointment in the last year OR does the patient have an upcoming appointment? Yes  Can we respond through MyChart? Yes  Agent: Please be advised that Rx refills may take up to 3 business days. We ask that you follow-up with your pharmacy.

## 2023-12-08 MED ORDER — ATORVASTATIN CALCIUM 10 MG PO TABS
10.0000 mg | ORAL_TABLET | Freq: Every day | ORAL | 0 refills | Status: DC
Start: 2023-12-08 — End: 2023-12-29

## 2023-12-08 NOTE — Telephone Encounter (Signed)
 Requested Prescriptions  Pending Prescriptions Disp Refills   atorvastatin  (LIPITOR) 10 MG tablet 90 tablet 0    Sig: Take 1 tablet (10 mg total) by mouth daily.     Cardiovascular:  Antilipid - Statins Failed - 12/08/2023  1:00 PM      Failed - Lipid Panel in normal range within the last 12 months    Cholesterol, Total  Date Value Ref Range Status  01/01/2021 112 100 - 199 mg/dL Final   Cholesterol  Date Value Ref Range Status  12/21/2022 104 0 - 200 Final   LDL Chol Calc (NIH)  Date Value Ref Range Status  01/01/2021 42 0 - 99 mg/dL Final   LDL Cholesterol  Date Value Ref Range Status  12/21/2022 34  Final   HDL  Date Value Ref Range Status  12/21/2022 38 35 - 70 Final  01/01/2021 53 >39 mg/dL Final   Triglycerides  Date Value Ref Range Status  12/21/2022 161 (A) 40 - 160 Final         Passed - Patient is not pregnant      Passed - Valid encounter within last 12 months    Recent Outpatient Visits           3 months ago Microcytosis    Primary Care & Sports Medicine at [facility], Leita DEL, MD       Future Appointments             In 3 weeks Justus, Leita DEL, MD [facility] Primary Care & Sports Medicine at [facility], [facility]

## 2023-12-28 DIAGNOSIS — E1129 Type 2 diabetes mellitus with other diabetic kidney complication: Secondary | ICD-10-CM | POA: Diagnosis not present

## 2023-12-28 DIAGNOSIS — K7469 Other cirrhosis of liver: Secondary | ICD-10-CM | POA: Diagnosis not present

## 2023-12-28 DIAGNOSIS — Z794 Long term (current) use of insulin: Secondary | ICD-10-CM | POA: Diagnosis not present

## 2023-12-28 DIAGNOSIS — E782 Mixed hyperlipidemia: Secondary | ICD-10-CM | POA: Diagnosis not present

## 2023-12-28 DIAGNOSIS — Z1331 Encounter for screening for depression: Secondary | ICD-10-CM | POA: Diagnosis not present

## 2023-12-28 DIAGNOSIS — R809 Proteinuria, unspecified: Secondary | ICD-10-CM | POA: Diagnosis not present

## 2023-12-28 DIAGNOSIS — I10 Essential (primary) hypertension: Secondary | ICD-10-CM | POA: Diagnosis not present

## 2023-12-28 LAB — HEMOGLOBIN A1C: Hemoglobin A1C: 7.6

## 2023-12-29 ENCOUNTER — Encounter: Payer: Self-pay | Admitting: Internal Medicine

## 2023-12-29 ENCOUNTER — Ambulatory Visit (INDEPENDENT_AMBULATORY_CARE_PROVIDER_SITE_OTHER): Admitting: Internal Medicine

## 2023-12-29 VITALS — BP 128/74 | HR 66 | Ht 67.0 in | Wt 217.0 lb

## 2023-12-29 DIAGNOSIS — Z1211 Encounter for screening for malignant neoplasm of colon: Secondary | ICD-10-CM

## 2023-12-29 DIAGNOSIS — Z7984 Long term (current) use of oral hypoglycemic drugs: Secondary | ICD-10-CM

## 2023-12-29 DIAGNOSIS — E1169 Type 2 diabetes mellitus with other specified complication: Secondary | ICD-10-CM

## 2023-12-29 DIAGNOSIS — I10 Essential (primary) hypertension: Secondary | ICD-10-CM | POA: Diagnosis not present

## 2023-12-29 DIAGNOSIS — Z1231 Encounter for screening mammogram for malignant neoplasm of breast: Secondary | ICD-10-CM | POA: Diagnosis not present

## 2023-12-29 DIAGNOSIS — Z7985 Long-term (current) use of injectable non-insulin antidiabetic drugs: Secondary | ICD-10-CM

## 2023-12-29 DIAGNOSIS — Z794 Long term (current) use of insulin: Secondary | ICD-10-CM

## 2023-12-29 DIAGNOSIS — K766 Portal hypertension: Secondary | ICD-10-CM

## 2023-12-29 DIAGNOSIS — Z0000 Encounter for general adult medical examination without abnormal findings: Secondary | ICD-10-CM | POA: Diagnosis not present

## 2023-12-29 DIAGNOSIS — E118 Type 2 diabetes mellitus with unspecified complications: Secondary | ICD-10-CM | POA: Diagnosis not present

## 2023-12-29 DIAGNOSIS — E785 Hyperlipidemia, unspecified: Secondary | ICD-10-CM

## 2023-12-29 MED ORDER — FLUCONAZOLE 100 MG PO TABS: 100.0000 mg | ORAL_TABLET | Freq: Every day | ORAL | 0 refills | Status: DC

## 2023-12-29 MED ORDER — ATORVASTATIN CALCIUM 10 MG PO TABS: 10.0000 mg | ORAL_TABLET | Freq: Every day | ORAL | 1 refills | Status: AC

## 2023-12-29 MED ORDER — CARVEDILOL 6.25 MG PO TABS: 6.2500 mg | ORAL_TABLET | Freq: Two times a day (BID) | ORAL | 1 refills | Status: AC

## 2023-12-29 MED ORDER — ATORVASTATIN CALCIUM 10 MG PO TABS: 10.0000 mg | ORAL_TABLET | Freq: Every day | ORAL | 1 refills | Status: DC

## 2023-12-29 MED ORDER — LISINOPRIL-HYDROCHLOROTHIAZIDE 20-12.5 MG PO TABS: 1.0000 | ORAL_TABLET | Freq: Two times a day (BID) | ORAL | 1 refills | Status: DC

## 2023-12-29 MED ORDER — LISINOPRIL-HYDROCHLOROTHIAZIDE 20-12.5 MG PO TABS: 1.0000 | ORAL_TABLET | Freq: Two times a day (BID) | ORAL | 1 refills | Status: AC

## 2023-12-29 MED ORDER — FLUCONAZOLE 100 MG PO TABS: 100.0000 mg | ORAL_TABLET | Freq: Every day | ORAL | 0 refills | Status: AC

## 2023-12-29 MED ORDER — CARVEDILOL 6.25 MG PO TABS: 6.2500 mg | ORAL_TABLET | Freq: Two times a day (BID) | ORAL | 1 refills | Status: DC

## 2023-12-29 NOTE — Progress Notes (Signed)
 Date:  12/29/2023   Name:  Meagan Ibarra   DOB:  04/11/1961   MRN:  [id number]   Chief Complaint: Annual Exam Meagan Ibarra is a 63 y.o. female who presents today for her Complete Annual Exam. She feels well. She reports exercising. She reports she is sleeping well. Breast complaints - none.  Health Maintenance  Topic Date Due   Eye exam for diabetics  04/24/2020   DTaP/Tdap/Td vaccine (2 - Td or Tdap) 02/01/2021   Mammogram  03/21/2023   Flu Shot  08/08/2024*   Colon Cancer Screening  06/12/2024   Hemoglobin A1C  06/29/2024   Pap with HPV screening  08/13/2024   Complete foot exam   09/02/2024   Yearly kidney health urinalysis for diabetes  09/08/2024   Yearly kidney function blood test for diabetes  10/19/2024   Pneumococcal Vaccine for age over 1  Completed   Hepatitis B Vaccine  Completed   Hepatitis C Screening  Completed   Zoster (Shingles) Vaccine  Completed   HIV Screening  Addressed   HPV Vaccine  Aged Out   Meningitis B Vaccine  Aged Out   Pneumococcal Vaccine  Discontinued   COVID-19 Vaccine  Discontinued  *Topic was postponed. The date shown is not the original due date.    Hypertension This is a chronic problem. The problem is controlled. Pertinent negatives include no chest pain, headaches, palpitations or shortness of breath. Past treatments include ACE inhibitors, diuretics and beta blockers. The current treatment provides significant improvement. Hypertensive end-organ damage includes kidney disease, CAD/MI and CVA.  Diabetes She presents for her follow-up diabetic visit. She has type 2 diabetes mellitus. Her disease course has been stable. Pertinent negatives for hypoglycemia include no dizziness or headaches. Pertinent negatives for diabetes include no chest pain, no fatigue and no weakness. Diabetic complications include a CVA. Current diabetic treatments: MTF, Jardiance, Insulin  and Ozempic . She is compliant with treatment all of the time.   Hyperlipidemia This is a chronic problem. The problem is controlled. Pertinent negatives include no chest pain, myalgias or shortness of breath. Current antihyperlipidemic treatment includes statins. The current treatment provides significant improvement of lipids.    Review of Systems  Constitutional:  Negative for fatigue and unexpected weight change.  HENT:  Negative for trouble swallowing.   Eyes:  Negative for visual disturbance.  Respiratory:  Negative for cough, chest tightness, shortness of breath and wheezing.   Cardiovascular:  Negative for chest pain, palpitations and leg swelling.  Gastrointestinal:  Negative for abdominal pain, constipation and diarrhea.  Musculoskeletal:  Negative for arthralgias and myalgias.  Neurological:  Negative for dizziness, weakness, light-headedness and headaches.     Lab Results  Component Value Date   NA 141 10/20/2023   K 3.5 10/20/2023   CO2 24 (A) 10/20/2023   GLUCOSE 186 (H) 01/01/2021   BUN 20 10/20/2023   CREATININE 1.0 10/20/2023   CALCIUM  9.6 10/20/2023   EGFR 64 10/20/2023   GFRNONAA 62 08/14/2019   Lab Results  Component Value Date   CHOL 104 12/21/2022   HDL 38 12/21/2022   LDLCALC 34 12/21/2022   TRIG 161 (A) 12/21/2022   CHOLHDL 2.1 01/01/2021   Lab Results  Component Value Date   TSH 1.760 01/01/2021   Lab Results  Component Value Date   HGBA1C 7.6 12/28/2023   Lab Results  Component Value Date   WBC 5.3 10/20/2023   HGB 12.3 10/20/2023   HCT 38 10/20/2023   MCV  77 (L) 09/03/2023   PLT 69 (A) 10/20/2023   Lab Results  Component Value Date   ALT 38 (A) 10/20/2023   AST 39 (A) 10/20/2023   ALKPHOS 65 10/20/2023   BILITOT 0.9 01/01/2021   No results found for: MARIEN BOLLS, VD25OH   Patient Active Problem List   Diagnosis Date Noted   Eczema 09/03/2023   Portal hypertension (HCC) 06/25/2019   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/10/2019   Type II diabetes mellitus with  complication (HCC) 02/24/2018   Chronic right shoulder pain 03/03/2017   Adenomatous colon polyp 04/17/2016   FH: CAD (coronary artery disease) 04/17/2016   Essential (primary) hypertension 11/07/2014   Hyperlipidemia associated with type 2 diabetes mellitus (HCC) 11/07/2014   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/22/2014   Fibroid 10/03/2013    Allergies  Allergen Reactions   Ozempic  [Semaglutide ] Nausea And Vomiting   Invokana  [Canagliflozin ] Itching    Past Surgical History:  Procedure Laterality Date   BREAST REDUCTION SURGERY  1980   BREAST SURGERY     COLONOSCOPY WITH PROPOFOL  N/A 12/31/2014   19 polyps found   COLONOSCOPY WITH PROPOFOL  N/A 06/05/2016   Procedure: COLONOSCOPY WITH PROPOFOL ;  Surgeon: Gladis RAYMOND Mariner, MD;  Location: [facility] ENDOSCOPY;  Service: Endoscopy;  Laterality: N/A;    Social History   Tobacco Use   Smoking status: Former    Passive exposure: Past   Smokeless tobacco: Never  Vaping Use   Vaping status: Never Used  Substance Use Topics   Alcohol use: No    Alcohol/week: 0.0 standard drinks of alcohol   Drug use: No     Medication list has been reviewed and updated.  Current Meds  Medication Sig   empagliflozin (JARDIANCE) 25 MG TABS tablet Take 25 mg by mouth daily.   Insulin  Degludec (TRESIBA) 100 UNIT/ML SOLN Inject 80 Units into the skin daily.   Insulin  Pen Needle (CARETOUCH PEN NEEDLES) 31G X 5 MM MISC 1 each by Does not apply route daily.   metFORMIN  (GLUCOPHAGE ) 500 MG tablet Take 1 tablet (500 mg total) by mouth 2 (two) times daily with a meal.   Semaglutide  (RYBELSUS ) 14 MG TABS Take 1 tablet by mouth daily.   triamcinolone  cream (KENALOG) 0.1 % Apply 1 Application topically 2 (two) times daily as needed.   [DISCONTINUED] atorvastatin  (LIPITOR) 10 MG tablet Take 1 tablet (10 mg total) by mouth daily.   [DISCONTINUED] carvedilol  (COREG ) 12.5 MG tablet TAKE 1 TABLET(12.5 MG) BY MOUTH TWICE DAILY WITH A MEAL (Patient taking  differently: Take 12.5 mg by mouth daily.)   [DISCONTINUED] fluconazole  (DIFLUCAN ) 100 MG tablet Take 1 tablet (100 mg total) by mouth daily for 7 days.   [DISCONTINUED] lisinopril -hydrochlorothiazide  (ZESTORETIC ) 20-12.5 MG tablet Take 1 tablet by mouth 2 (two) times daily.   [DISCONTINUED] Semaglutide  3 MG TABS Take 3 mg by mouth. (Patient taking differently: Take 14 mg by mouth daily.)       12/25/2022    9:35 AM 01/01/2022    9:55 AM 07/15/2021   10:27 AM 01/01/2021    9:32 AM  GAD 7 : Generalized Anxiety Score  Nervous, Anxious, on Edge 0 0 0 0  Control/stop worrying 0 0 0 0  Worry too much - different things 0 0 0 0  Trouble relaxing 0 0 0 0  Restless 3 0 0 0  Easily annoyed or irritable 3 0 0 0  Afraid - awful might happen 0 0 0 0  Total  GAD 7 Score 6 0 0 0  Anxiety Difficulty Not difficult at all Not difficult at all Not difficult at all        12/25/2022    9:34 AM 01/01/2022    9:55 AM 07/15/2021   10:27 AM  Depression screen PHQ 2/9  Decreased Interest 3 0 0  Down, Depressed, Hopeless 0 0 0  PHQ - 2 Score 3 0 0  Altered sleeping 0 0 0  Tired, decreased energy 0 0 0  Change in appetite 0 0 0  Feeling bad or failure about yourself  0 0 0  Trouble concentrating 0 0 0  Moving slowly or fidgety/restless 0 0 0  Suicidal thoughts 0 0 0  PHQ-9 Score 3 0 0  Difficult doing work/chores Not difficult at all Not difficult at all Not difficult at all    BP Readings from Last 3 Encounters:  12/29/23 128/74  10/06/23 123/70  09/20/23 110/68    Physical Exam Vitals and nursing note reviewed.  Constitutional:      General: She is not in acute distress.    Appearance: She is well-developed.  HENT:     Head: Normocephalic and atraumatic.     Right Ear: Tympanic membrane and ear canal normal.     Left Ear: Tympanic membrane and ear canal normal.     Nose:     Right Sinus: No maxillary sinus tenderness.     Left Sinus: No maxillary sinus tenderness.  Eyes:     General: No  scleral icterus.       Right eye: No discharge.        Left eye: No discharge.     Conjunctiva/sclera: Conjunctivae normal.  Neck:     Thyroid: No thyromegaly.     Vascular: No carotid bruit.  Cardiovascular:     Rate and Rhythm: Normal rate and regular rhythm.     Pulses: Normal pulses.     Heart sounds: Normal heart sounds.  Pulmonary:     Effort: Pulmonary effort is normal. No respiratory distress.     Breath sounds: No wheezing.  Abdominal:     General: Bowel sounds are normal.     Palpations: Abdomen is soft.     Tenderness: There is no abdominal tenderness.  Musculoskeletal:     Cervical back: Normal range of motion. No erythema.     Right lower leg: No edema.     Left lower leg: No edema.  Lymphadenopathy:     Cervical: No cervical adenopathy.  Skin:    General: Skin is warm and dry.     Findings: No rash.  Neurological:     Mental Status: She is alert and oriented to person, place, and time.     Cranial Nerves: No cranial nerve deficit.     Sensory: No sensory deficit.     Deep Tendon Reflexes: Reflexes are normal and symmetric.  Psychiatric:        Attention and Perception: Attention normal.        Mood and Affect: Mood normal.     Wt Readings from Last 3 Encounters:  12/29/23 217 lb (98.4 kg)  10/06/23 214 lb (97.1 kg)  09/20/23 217 lb (98.4 kg)    BP 128/74   Pulse 66   Ht 5' 7 (1.702 m)   Wt 217 lb (98.4 kg)   SpO2 96%   BMI 33.99 kg/m   Assessment and Plan:  Problem List Items Addressed This Visit  Unprioritized   Essential (primary) hypertension (Chronic)   Blood pressure is well controlled on Coreg  once a day, lisinopril  and hydrochlorothiazide . No medication side effects noted. Plan to continue current medications. BP was low so she reduced the Coreg  to once a day - but needs to take 1/2 twice a day.       Relevant Medications   atorvastatin  (LIPITOR) 10 MG tablet   carvedilol  (COREG ) 6.25 MG tablet    lisinopril -hydrochlorothiazide  (ZESTORETIC ) 20-12.5 MG tablet   Other Relevant Orders   TSH   Hyperlipidemia associated with type 2 diabetes mellitus (HCC) (Chronic)   Relevant Medications   Insulin  Degludec (TRESIBA) 100 UNIT/ML SOLN   Semaglutide  (RYBELSUS ) 14 MG TABS   atorvastatin  (LIPITOR) 10 MG tablet   carvedilol  (COREG ) 6.25 MG tablet   lisinopril -hydrochlorothiazide  (ZESTORETIC ) 20-12.5 MG tablet   Other Relevant Orders   Lipid panel   Type II diabetes mellitus with complication (HCC) (Chronic)   Blood sugars have been stable.  No hypoglycemic events since last visit. Currently medications are Rybelsus , [place], MTF and Jardiance. Last visit medical regimen changes were changing Ozempic  to Rybelus. Lab Results  Component Value Date   HGBA1C 7.6 12/28/2023          Relevant Medications   Insulin  Degludec (TRESIBA) 100 UNIT/ML SOLN   Semaglutide  (RYBELSUS ) 14 MG TABS   atorvastatin  (LIPITOR) 10 MG tablet   lisinopril -hydrochlorothiazide  (ZESTORETIC ) 20-12.5 MG tablet   Portal hypertension (HCC) (Chronic)   Relevant Medications   atorvastatin  (LIPITOR) 10 MG tablet   carvedilol  (COREG ) 6.25 MG tablet   lisinopril -hydrochlorothiazide  (ZESTORETIC ) 20-12.5 MG tablet   Other Visit Diagnoses       Annual physical exam    -  Primary     Encounter for screening mammogram for breast cancer       scheduled for November   Relevant Orders   MM 3D SCREENING MAMMOGRAM BILATERAL BREAST     Colon cancer screening       Relevant Orders   Ambulatory referral to Gastroenterology       Return in about 6 months (around 06/30/2024) for HTN, DM - Dr. Lemon.    Leita HILARIO Adie, MD [facility] Primary Care and Sports Medicine [place]

## 2023-12-29 NOTE — Assessment & Plan Note (Signed)
 Blood sugars have been stable.  No hypoglycemic events since last visit. Currently medications are Rybelsus , [place], MTF and Jardiance. Last visit medical regimen changes were changing Ozempic  to Rybelus. Lab Results  Component Value Date   HGBA1C 7.6 12/28/2023

## 2023-12-29 NOTE — Assessment & Plan Note (Signed)
 Blood pressure is well controlled on Coreg  once a day, lisinopril  and hydrochlorothiazide . No medication side effects noted. Plan to continue current medications. BP was low so she reduced the Coreg  to once a day - but needs to take 1/2 twice a day.

## 2024-01-20 DIAGNOSIS — E1169 Type 2 diabetes mellitus with other specified complication: Secondary | ICD-10-CM | POA: Diagnosis not present

## 2024-01-20 DIAGNOSIS — E785 Hyperlipidemia, unspecified: Secondary | ICD-10-CM | POA: Diagnosis not present

## 2024-01-20 DIAGNOSIS — I10 Essential (primary) hypertension: Secondary | ICD-10-CM | POA: Diagnosis not present

## 2024-01-21 ENCOUNTER — Ambulatory Visit: Payer: Self-pay | Admitting: Internal Medicine

## 2024-01-21 LAB — TSH: TSH: 1.52 u[IU]/mL (ref 0.450–4.500)

## 2024-01-21 LAB — LIPID PANEL
Chol/HDL Ratio: 2.3 ratio (ref 0.0–4.4)
Cholesterol, Total: 116 mg/dL (ref 100–199)
HDL: 51 mg/dL (ref >39)
LDL Chol Calc (NIH): 48 mg/dL (ref 0–99)
Triglycerides: 87 mg/dL (ref 0–149)
VLDL Cholesterol Cal: 17 mg/dL (ref 5–40)

## 2024-02-26 ENCOUNTER — Other Ambulatory Visit: Payer: Self-pay | Admitting: Internal Medicine

## 2024-02-26 DIAGNOSIS — I10 Essential (primary) hypertension: Secondary | ICD-10-CM

## 2024-02-29 NOTE — Telephone Encounter (Signed)
 Requested Prescriptions  Refused Prescriptions Disp Refills   lisinopril -hydrochlorothiazide  (ZESTORETIC ) 20-12.5 MG tablet [Pharmacy Med Name: LISINOPRIL -HCTZ 20/12.5MG  TABLETS] 180 tablet 1    Sig: TAKE 1 TABLET BY MOUTH TWICE DAILY     Cardiovascular:  ACEI + Diuretic Combos Passed - 02/29/2024 10:17 AM      Passed - Na in normal range and within 180 days    Sodium  Date Value Ref Range Status  10/20/2023 141 137 - 147 Final  12/12/2013 137 136 - 145 mmol/L Final         Passed - K in normal range and within 180 days    Potassium  Date Value Ref Range Status  10/20/2023 3.5 3.5 - 5.1 mEq/L Final  12/12/2013 3.9 3.5 - 5.1 mmol/L Final         Passed - Cr in normal range and within 180 days    Creatinine  Date Value Ref Range Status  10/20/2023 1.0 0.5 - 1.1 Final  12/12/2013 1.04 0.60 - 1.30 mg/dL Final   Creatinine, Ser  Date Value Ref Range Status  01/01/2021 0.88 0.57 - 1.00 mg/dL Final   Creatinine, Urine  Date Value Ref Range Status  09/09/2023 75  Final         Passed - eGFR is 30 or above and within 180 days    EGFR (African American)  Date Value Ref Range Status  12/12/2013 >60  Final   GFR calc Af Amer  Date Value Ref Range Status  08/14/2019 71 >59 mL/min/1.73 Final   EGFR (Non-African Amer.)  Date Value Ref Range Status  12/12/2013 >60  Final    Comment:    eGFR values <36mL/min/1.73 m2 may be an indication of chronic kidney disease (CKD). Calculated eGFR is useful in patients with stable renal function. The eGFR calculation will not be reliable in acutely ill patients when serum creatinine is changing rapidly. It is not useful in  patients on dialysis. The eGFR calculation may not be applicable to patients at the low and high extremes of body sizes, pregnant women, and vegetarians.    GFR calc non Af Amer  Date Value Ref Range Status  08/14/2019 62 >59 mL/min/1.73 Final   eGFR  Date Value Ref Range Status  10/20/2023 64  Final   01/01/2021 76 >59 mL/min/1.73 Final         Passed - Patient is not pregnant      Passed - Last BP in normal range    BP Readings from Last 1 Encounters:  12/29/23 128/74         Passed - Valid encounter within last 6 months    Recent Outpatient Visits           2 months ago Annual physical exam   [facility] Primary Care & Sports Medicine at [facility], Leita DEL, MD   5 months ago Microcytosis   [facility] Primary Care & Sports Medicine at [facility], Leita DEL, MD

## 2024-03-17 ENCOUNTER — Other Ambulatory Visit: Payer: Self-pay | Admitting: Internal Medicine

## 2024-03-17 DIAGNOSIS — E1169 Type 2 diabetes mellitus with other specified complication: Secondary | ICD-10-CM

## 2024-03-18 NOTE — Telephone Encounter (Signed)
 Too soon for refill.  Requested Prescriptions  Pending Prescriptions Disp Refills   atorvastatin  (LIPITOR) 10 MG tablet [Pharmacy Med Name: ATORVASTATIN  10MG  TABLETS] 90 tablet 1    Sig: TAKE 1 TABLET(10 MG) BY MOUTH DAILY     Cardiovascular:  Antilipid - Statins Failed - 03/18/2024 10:01 AM      Failed - Lipid Panel in normal range within the last 12 months    Cholesterol, Total  Date Value Ref Range Status  01/20/2024 116 100 - 199 mg/dL Final   LDL Chol Calc (NIH)  Date Value Ref Range Status  01/20/2024 48 0 - 99 mg/dL Final   HDL  Date Value Ref Range Status  01/20/2024 51 >39 mg/dL Final   Triglycerides  Date Value Ref Range Status  01/20/2024 87 0 - 149 mg/dL Final         Passed - Patient is not pregnant      Passed - Valid encounter within last 12 months    Recent Outpatient Visits           2 months ago Annual physical exam   [place] Primary Care & Sports Medicine at [facility], Leita DEL, MD   6 months ago Microcytosis   [facility] Primary Care & Sports Medicine at [facility], Leita DEL, MD

## 2024-03-24 DIAGNOSIS — Z1231 Encounter for screening mammogram for malignant neoplasm of breast: Secondary | ICD-10-CM | POA: Diagnosis not present

## 2024-03-24 LAB — HM MAMMOGRAPHY

## 2024-04-02 ENCOUNTER — Other Ambulatory Visit: Payer: Self-pay | Admitting: Internal Medicine

## 2024-04-02 DIAGNOSIS — I10 Essential (primary) hypertension: Secondary | ICD-10-CM

## 2024-04-02 DIAGNOSIS — K766 Portal hypertension: Secondary | ICD-10-CM

## 2024-04-03 NOTE — Telephone Encounter (Signed)
 Too soon for refill.  Requested Prescriptions  Pending Prescriptions Disp Refills   carvedilol  (COREG ) 6.25 MG tablet [Pharmacy Med Name: CARVEDILOL  6.25MG  TABLETS] 180 tablet 1    Sig: TAKE 1 TABLET(6.25 MG) BY MOUTH TWICE DAILY WITH A MEAL     Cardiovascular: Beta Blockers 3 Failed - 04/03/2024  4:20 PM      Failed - AST in normal range and within 360 days    AST  Date Value Ref Range Status  10/20/2023 39 (A) 13 - 35 Final   SGOT(AST)  Date Value Ref Range Status  12/12/2013 43 (H) 15 - 37 Unit/L Final         Failed - ALT in normal range and within 360 days    ALT  Date Value Ref Range Status  10/20/2023 38 (A) 7 - 35 U/L Final   SGPT (ALT)  Date Value Ref Range Status  12/12/2013 37 U/L Final    Comment:    14-63 NOTE: New Reference Range 11/28/13          Passed - Cr in normal range and within 360 days    Creatinine  Date Value Ref Range Status  10/20/2023 1.0 0.5 - 1.1 Final  12/12/2013 1.04 0.60 - 1.30 mg/dL Final   Creatinine, Ser  Date Value Ref Range Status  01/01/2021 0.88 0.57 - 1.00 mg/dL Final   Creatinine, Urine  Date Value Ref Range Status  09/09/2023 75  Final         Passed - Last BP in normal range    BP Readings from Last 1 Encounters:  12/29/23 128/74         Passed - Last Heart Rate in normal range    Pulse Readings from Last 1 Encounters:  12/29/23 66         Passed - Valid encounter within last 6 months    Recent Outpatient Visits           3 months ago Annual physical exam   [facility] Primary Care & Sports Medicine at [facility], Leita DEL, MD   7 months ago Microcytosis   [facility] Primary Care & Sports Medicine at [facility], Leita DEL, MD

## 2024-04-24 DIAGNOSIS — K7460 Unspecified cirrhosis of liver: Secondary | ICD-10-CM | POA: Diagnosis not present

## 2024-04-24 DIAGNOSIS — R188 Other ascites: Secondary | ICD-10-CM | POA: Diagnosis not present

## 2024-04-24 DIAGNOSIS — K769 Liver disease, unspecified: Secondary | ICD-10-CM | POA: Diagnosis not present

## 2024-06-15 ENCOUNTER — Ambulatory Visit: Admitting: Student

## 2024-06-15 VITALS — BP 122/80 | HR 70 | Temp 98.0°F | Ht 67.0 in | Wt 224.0 lb

## 2024-06-15 DIAGNOSIS — R350 Frequency of micturition: Secondary | ICD-10-CM | POA: Insufficient documentation

## 2024-06-15 LAB — POCT URINALYSIS DIPSTICK
Bilirubin, UA: NEGATIVE
Blood, UA: NEGATIVE
Glucose, UA: POSITIVE — AB
Ketones, UA: POSITIVE — AB
Leukocytes, UA: NEGATIVE
Nitrite, UA: NEGATIVE
Protein, UA: NEGATIVE
Spec Grav, UA: 1.01
Urobilinogen, UA: NEGATIVE U/dL — AB
pH, UA: 6.5

## 2024-06-15 NOTE — Progress Notes (Signed)
 "  Established Patient Office Visit  Subjective   Patient ID: Meagan Ibarra, female    DOB: 29-Mar-1961  Age: 64 y.o. MRN: [id number]  Chief Complaint  Patient presents with   Urinary Tract Infection    4-5 days ago, frequent urination, pressure sensation, no burning or itching     Meagan Ibarra is a 64 y.o. person with medical hx listed below who presents today for frequent urination and pressure for 4-5 days ago. No burning with urination. Did a home urine test +leukocytes and no nitrites. Needs to go out town tomorrow. Has takes more baths and using new lush bombs lately. Denies fevers, chills, flank pain, hematuria, vaginal discharge, rashes, or urinary incontinent. Glucose this morning 76.   Patient Active Problem List   Diagnosis Date Noted   Urinary frequency 06/15/2024   Eczema 09/03/2023   Portal hypertension (HCC) 06/25/2019   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/10/2019   Type II diabetes mellitus with complication (HCC) 02/24/2018   Chronic right shoulder pain 03/03/2017   Adenomatous colon polyp 04/17/2016   FH: CAD (coronary artery disease) 04/17/2016   Essential (primary) hypertension 11/07/2014   Hyperlipidemia associated with type 2 diabetes mellitus (HCC) 11/07/2014   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/22/2014   Fibroid 10/03/2013      ROS Refer to HPI    Objective:     Outpatient Encounter Medications as of 06/15/2024  Medication Sig   atorvastatin  (LIPITOR) 10 MG tablet Take 1 tablet (10 mg total) by mouth daily.   carvedilol  (COREG ) 6.25 MG tablet Take 1 tablet (6.25 mg total) by mouth 2 (two) times daily with a meal.   empagliflozin (JARDIANCE) 25 MG TABS tablet Take 25 mg by mouth daily.   Insulin  Degludec (TRESIBA) 100 UNIT/ML SOLN Inject 80 Units into the skin daily.   Insulin  Pen Needle (CARETOUCH PEN NEEDLES) 31G X 5 MM MISC 1 each by Does not apply route daily.   lisinopril -hydrochlorothiazide  (ZESTORETIC ) 20-12.5 MG tablet Take 1  tablet by mouth 2 (two) times daily.   metFORMIN  (GLUCOPHAGE ) 500 MG tablet Take 1 tablet (500 mg total) by mouth 2 (two) times daily with a meal.   Semaglutide  (RYBELSUS ) 14 MG TABS Take 1 tablet by mouth daily.   triamcinolone  cream (KENALOG) 0.1 % Apply 1 Application topically 2 (two) times daily as needed.   No facility-administered encounter medications on file as of 06/15/2024.    BP 122/80   Pulse 70   Temp 98 F (36.7 C) (Oral)   Ht 5' 7 (1.702 m)   Wt 224 lb (101.6 kg)   SpO2 97%   BMI 35.08 kg/m  BP Readings from Last 3 Encounters:  06/15/24 122/80  12/29/23 128/74  10/06/23 123/70    Physical Exam Constitutional:      Appearance: Normal appearance.  HENT:     Mouth/Throat:     Mouth: Mucous membranes are moist.     Pharynx: Oropharynx is clear.  Cardiovascular:     Rate and Rhythm: Normal rate and regular rhythm.  Pulmonary:     Effort: Pulmonary effort is normal.     Breath sounds: No rhonchi or rales.  Abdominal:     General: Abdomen is flat. Bowel sounds are normal. There is no distension.     Palpations: Abdomen is soft.     Tenderness: There is no abdominal tenderness. There is no right CVA tenderness or left CVA tenderness.  Musculoskeletal:  General: Normal range of motion.     Right lower leg: No edema.     Left lower leg: No edema.  Skin:    General: Skin is warm and dry.     Capillary Refill: Capillary refill takes less than 2 seconds.  Neurological:     General: No focal deficit present.     Mental Status: She is alert and oriented to person, place, and time.  Psychiatric:        Mood and Affect: Mood normal.        Behavior: Behavior normal.        06/15/2024    3:52 PM 12/25/2022    9:34 AM 01/01/2022    9:55 AM  Depression screen PHQ 2/9  Decreased Interest 0 3 0  Down, Depressed, Hopeless 0 0 0  PHQ - 2 Score 0 3 0  Altered sleeping  0 0  Tired, decreased energy  0 0  Change in appetite  0 0  Feeling bad or failure about  yourself   0 0  Trouble concentrating  0 0  Moving slowly or fidgety/restless  0 0  Suicidal thoughts  0 0  PHQ-9 Score  3  0   Difficult doing work/chores  Not difficult at all Not difficult at all     Data saved with a previous flowsheet row definition       06/15/2024    3:52 PM 12/25/2022    9:35 AM 01/01/2022    9:55 AM 07/15/2021   10:27 AM  GAD 7 : Generalized Anxiety Score  Nervous, Anxious, on Edge 0 0  0  0   Control/stop worrying 0 0  0  0   Worry too much - different things  0  0  0   Trouble relaxing  0  0  0   Restless  3  0  0   Easily annoyed or irritable  3  0  0   Afraid - awful might happen  0  0  0   Total GAD 7 Score  6 0 0  Anxiety Difficulty  Not difficult at all Not difficult at all Not difficult at all     Data saved with a previous flowsheet row definition    Results for orders placed or performed in visit on 06/15/24  POCT Urinalysis Dipstick  Result Value Ref Range   Color, UA yellow    Clarity, UA cloudy    Glucose, UA Positive (A) Negative   Bilirubin, UA negative    Ketones, UA positive (A)    Spec Grav, UA 1.010 1.010 - 1.025   Blood, UA negative    pH, UA 6.5 5.0 - 8.0   Protein, UA Negative Negative   Urobilinogen, UA negative (A) 0.2 or 1.0 E.U./dL   Nitrite, UA negative    Leukocytes, UA Negative Negative   Appearance     Odor      Last CBC Lab Results  Component Value Date   WBC 5.3 10/20/2023   HGB 12.3 10/20/2023   HCT 38 10/20/2023   MCV 77 (L) 09/03/2023   MCH 23.8 (L) 09/03/2023   RDW 21.8 (H) 09/03/2023   PLT 69 (A) 10/20/2023   Last metabolic panel Lab Results  Component Value Date   GLUCOSE 186 (H) 01/01/2021   NA 141 10/20/2023   K 3.5 10/20/2023   CL 104 10/20/2023   CO2 24 (A) 10/20/2023   BUN 20 10/20/2023   CREATININE 1.0 10/20/2023  EGFR 64 10/20/2023   CALCIUM  9.6 10/20/2023   PROT 7.5 01/01/2021   ALBUMIN 4.5 01/01/2021   LABGLOB 3.0 01/01/2021   AGRATIO 1.5 01/01/2021   BILITOT 0.9 01/01/2021    ALKPHOS 65 10/20/2023   AST 39 (A) 10/20/2023   ALT 38 (A) 10/20/2023   ANIONGAP 8 12/12/2013   Last lipids Lab Results  Component Value Date   CHOL 116 01/20/2024   HDL 51 01/20/2024   LDLCALC 48 01/20/2024   TRIG 87 01/20/2024   CHOLHDL 2.3 01/20/2024   Last hemoglobin A1c Lab Results  Component Value Date   HGBA1C 7.6 12/28/2023      The ASCVD Risk score (Arnett DK, et al., 2019) failed to calculate for the following reasons:   The valid total cholesterol range is 130 to 320 mg/dL    Assessment & Plan:  Urinary frequency POC urine dipstick with glucose and ketones, negative for WBC, is on Jardiance for diabetes. Home glucoses have not been elevated. Possible urethral irration from frequent baths lately. Declined STI testing. Will sent urine for culture and UA. Treat based on results.  -     POCT urinalysis dipstick -     Urine Culture -     Urinalysis, Routine w reflex microscopic   Harlene Saddler, MD "

## 2024-06-16 LAB — URINALYSIS, ROUTINE W REFLEX MICROSCOPIC
Bilirubin, UA: NEGATIVE
Ketones, UA: NEGATIVE
Leukocytes,UA: NEGATIVE
Nitrite, UA: NEGATIVE
Protein,UA: NEGATIVE
RBC, UA: NEGATIVE
Specific Gravity, UA: 1.029 (ref 1.005–1.030)
Urobilinogen, Ur: 1 mg/dL (ref 0.2–1.0)
pH, UA: 6.5 (ref 5.0–7.5)

## 2024-06-30 ENCOUNTER — Encounter: Admitting: Student
